# Patient Record
Sex: Female | Born: 1980 | Race: White | Hispanic: No | Marital: Married | Smoking: Former smoker
Health system: Southern US, Community
[De-identification: ages and names within clinical notes are randomized; demographics above are authoritative.]

## PROBLEM LIST (undated history)

## (undated) ENCOUNTER — Inpatient Hospital Stay (HOSPITAL_COMMUNITY): Payer: Self-pay

## (undated) DIAGNOSIS — N3281 Overactive bladder: Secondary | ICD-10-CM

## (undated) DIAGNOSIS — M1990 Unspecified osteoarthritis, unspecified site: Secondary | ICD-10-CM

## (undated) DIAGNOSIS — F32A Depression, unspecified: Secondary | ICD-10-CM

## (undated) DIAGNOSIS — R87619 Unspecified abnormal cytological findings in specimens from cervix uteri: Secondary | ICD-10-CM

## (undated) DIAGNOSIS — IMO0002 Reserved for concepts with insufficient information to code with codable children: Secondary | ICD-10-CM

## (undated) DIAGNOSIS — R87629 Unspecified abnormal cytological findings in specimens from vagina: Secondary | ICD-10-CM

## (undated) DIAGNOSIS — D649 Anemia, unspecified: Secondary | ICD-10-CM

## (undated) HISTORY — PX: WISDOM TOOTH EXTRACTION: SHX21

## (undated) HISTORY — PX: URETHRAL SLING: SHX2621

## (undated) HISTORY — DX: Reserved for concepts with insufficient information to code with codable children: IMO0002

## (undated) HISTORY — DX: Anemia, unspecified: D64.9

## (undated) HISTORY — DX: Depression, unspecified: F32.A

## (undated) HISTORY — PX: INCONTINENCE SURGERY: SHX676

## (undated) HISTORY — DX: Unspecified osteoarthritis, unspecified site: M19.90

## (undated) HISTORY — DX: Unspecified abnormal cytological findings in specimens from cervix uteri: R87.619

## (undated) HISTORY — PX: LEEP: SHX91

---

## 2008-02-07 ENCOUNTER — Encounter: Payer: Self-pay | Admitting: Obstetrics and Gynecology

## 2008-02-07 ENCOUNTER — Ambulatory Visit: Payer: Self-pay | Admitting: Obstetrics & Gynecology

## 2008-02-14 ENCOUNTER — Ambulatory Visit: Payer: Self-pay | Admitting: Obstetrics & Gynecology

## 2008-02-18 ENCOUNTER — Inpatient Hospital Stay (HOSPITAL_COMMUNITY): Admission: AD | Admit: 2008-02-18 | Discharge: 2008-02-18 | Payer: Self-pay | Admitting: Obstetrics & Gynecology

## 2008-02-18 ENCOUNTER — Ambulatory Visit: Payer: Self-pay | Admitting: Obstetrics and Gynecology

## 2008-02-21 ENCOUNTER — Ambulatory Visit: Payer: Self-pay | Admitting: Obstetrics & Gynecology

## 2008-02-25 ENCOUNTER — Inpatient Hospital Stay (HOSPITAL_COMMUNITY): Admission: AD | Admit: 2008-02-25 | Discharge: 2008-02-27 | Payer: Self-pay | Admitting: Obstetrics and Gynecology

## 2008-02-25 ENCOUNTER — Ambulatory Visit: Payer: Self-pay | Admitting: Obstetrics and Gynecology

## 2009-05-06 ENCOUNTER — Ambulatory Visit (HOSPITAL_BASED_OUTPATIENT_CLINIC_OR_DEPARTMENT_OTHER): Admission: RE | Admit: 2009-05-06 | Discharge: 2009-05-06 | Payer: Self-pay | Admitting: Urology

## 2009-11-06 ENCOUNTER — Encounter: Admission: RE | Admit: 2009-11-06 | Discharge: 2010-02-04 | Payer: Self-pay | Admitting: Urology

## 2011-02-07 LAB — PROTIME-INR: Prothrombin Time: 13.4 seconds (ref 11.6–15.2)

## 2011-02-07 LAB — DIFFERENTIAL
Basophils Absolute: 0 10*3/uL (ref 0.0–0.1)
Eosinophils Relative: 3 % (ref 0–5)
Lymphocytes Relative: 39 % (ref 12–46)

## 2011-02-07 LAB — TYPE AND SCREEN: Antibody Screen: NEGATIVE

## 2011-02-07 LAB — CBC
HCT: 39.3 % (ref 36.0–46.0)
Platelets: 285 10*3/uL (ref 150–400)
RDW: 12.3 % (ref 11.5–15.5)

## 2011-02-07 LAB — ABO/RH: ABO/RH(D): O POS

## 2011-03-16 NOTE — Op Note (Signed)
Heather Christian, Heather Christian            ACCOUNT NO.:  [id number]   MEDICAL RECORD NO.:  [id number]          PATIENT TYPE:  AMB   LOCATION:  [facility]                         FACILITY:  [facility]   PHYSICIAN:  Martina Sinner, MD DATE OF BIRTH:  12-17-1980   DATE OF PROCEDURE:  DATE OF DISCHARGE:                               OPERATIVE REPORT   PREOPERATIVE DIAGNOSIS:  Stress urinary incontinence.   POSTOPERATIVE DIAGNOSIS:  Stress urinary incontinence.   SURGERY:  Sling cystourethropexy [facility]) plus cystoscopy.   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  She consented to a sling.   The patient was prepped and draped in the usual fashion.  Extra care was  taken in leg positioning to minimize the risk of compartment syndrome,  neuropathy and DVT.  Preoperative antibiotics were given.   Two 1 cm incisions were made one fingerbreadth above the symphysis pubis  and 1.5 cm lateral to the midline.  A 2 cm incision was made underlying  the mid urethra, using a marking pen and lidocaine-epinephrine mixture  to develop a nice plane.  I sharply dissected to the urethrovesical  angle bilaterally.   When the bladder emptied, I passed a SPARC needle on top of on the back  of the symphysis pubic on the pulp of my index finger bilaterally.  I  cystoscoped the patient, and there was no injury to bladder or urethra,  and there was excellent efflux of blue dye bilaterally.  There was no  indentation __________ of the trocar.   With the bladder emptied, I attached a sling and brought it up through  the retropubic space.  I tensioned over the fat part of a moderate-sized  Kelly clamp.  I was very happy with the position and tension of the  sling and its lack of spring back.   Irrigation was utilized.  Sling was cut below the skin level.  4-0  Vicryl and Dermabond was used for abdominal incisions.  2-0 running TTY  needle was used for the vaginal incision followed by 2  interrupted  sutures.  Vaginal pack was inserted.   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  I did keep the sling in the  midurethra, and I do not expect any hinge effect.   Heather Christian will be followed up per protocol.           ______________________________  Martina Sinner, MD  Electronically Signed    SAM/MEDQ  D:  05/06/2009  T:  05/06/2009  Job:  [id number]

## 2011-05-17 ENCOUNTER — Ambulatory Visit (INDEPENDENT_AMBULATORY_CARE_PROVIDER_SITE_OTHER): Payer: Medicaid Other

## 2011-05-17 ENCOUNTER — Inpatient Hospital Stay (INDEPENDENT_AMBULATORY_CARE_PROVIDER_SITE_OTHER)
Admission: RE | Admit: 2011-05-17 | Discharge: 2011-05-17 | Disposition: A | Discharge status: Home / Self Care | Payer: Medicaid Other | Source: Ambulatory Visit | Attending: Family Medicine | Admitting: Family Medicine

## 2011-05-17 DIAGNOSIS — J189 Pneumonia, unspecified organism: Secondary | ICD-10-CM

## 2011-05-28 ENCOUNTER — Ambulatory Visit (INDEPENDENT_AMBULATORY_CARE_PROVIDER_SITE_OTHER): Payer: Medicaid Other

## 2011-05-28 ENCOUNTER — Inpatient Hospital Stay (INDEPENDENT_AMBULATORY_CARE_PROVIDER_SITE_OTHER)
Admission: RE | Admit: 2011-05-28 | Discharge: 2011-05-28 | Disposition: A | Discharge status: Home / Self Care | Payer: Medicaid Other | Source: Ambulatory Visit | Attending: Emergency Medicine | Admitting: Emergency Medicine

## 2011-05-28 DIAGNOSIS — M722 Plantar fascial fibromatosis: Secondary | ICD-10-CM

## 2011-05-28 DIAGNOSIS — J189 Pneumonia, unspecified organism: Secondary | ICD-10-CM

## 2011-05-31 ENCOUNTER — Inpatient Hospital Stay (INDEPENDENT_AMBULATORY_CARE_PROVIDER_SITE_OTHER)
Admission: RE | Admit: 2011-05-31 | Discharge: 2011-05-31 | Disposition: A | Discharge status: Home / Self Care | Payer: Medicaid Other | Source: Ambulatory Visit | Attending: Emergency Medicine | Admitting: Emergency Medicine

## 2011-05-31 DIAGNOSIS — R05 Cough: Secondary | ICD-10-CM

## 2011-05-31 DIAGNOSIS — J189 Pneumonia, unspecified organism: Secondary | ICD-10-CM

## 2011-07-19 ENCOUNTER — Inpatient Hospital Stay (HOSPITAL_COMMUNITY)
Admission: AD | Admit: 2011-07-19 | Discharge: 2011-07-20 | Disposition: A | Discharge status: Home / Self Care | Payer: Medicaid Other | Source: Ambulatory Visit | Attending: Obstetrics & Gynecology | Admitting: Obstetrics & Gynecology

## 2011-07-19 ENCOUNTER — Inpatient Hospital Stay (INDEPENDENT_AMBULATORY_CARE_PROVIDER_SITE_OTHER)
Admission: RE | Admit: 2011-07-19 | Discharge: 2011-07-19 | Disposition: A | Discharge status: Home / Self Care | Payer: Medicaid Other | Source: Ambulatory Visit | Attending: Family Medicine | Admitting: Family Medicine

## 2011-07-19 ENCOUNTER — Encounter (HOSPITAL_COMMUNITY): Payer: Self-pay | Admitting: *Deleted

## 2011-07-19 DIAGNOSIS — N390 Urinary tract infection, site not specified: Secondary | ICD-10-CM

## 2011-07-19 DIAGNOSIS — R109 Unspecified abdominal pain: Secondary | ICD-10-CM | POA: Insufficient documentation

## 2011-07-19 DIAGNOSIS — D72829 Elevated white blood cell count, unspecified: Secondary | ICD-10-CM | POA: Insufficient documentation

## 2011-07-19 DIAGNOSIS — N949 Unspecified condition associated with female genital organs and menstrual cycle: Secondary | ICD-10-CM

## 2011-07-19 DIAGNOSIS — Z975 Presence of (intrauterine) contraceptive device: Secondary | ICD-10-CM

## 2011-07-19 DIAGNOSIS — N83209 Unspecified ovarian cyst, unspecified side: Secondary | ICD-10-CM | POA: Insufficient documentation

## 2011-07-19 HISTORY — DX: Overactive bladder: N32.81

## 2011-07-19 LAB — POCT URINALYSIS DIP (DEVICE)
Hgb urine dipstick: NEGATIVE
Protein, ur: NEGATIVE mg/dL
Specific Gravity, Urine: 1.015 (ref 1.005–1.030)
Urobilinogen, UA: 0.2 mg/dL (ref 0.0–1.0)
pH: 7.5 (ref 5.0–8.0)

## 2011-07-19 LAB — WET PREP, GENITAL

## 2011-07-19 NOTE — Progress Notes (Signed)
Pt reports pain in lower abd mostly on rt side, hurts to sneeze, hurts to have intercourse, hurts to turn over in bed x 4 days. Denies fever. Dysuria. Has had IUD x 3 yrs.

## 2011-07-20 ENCOUNTER — Inpatient Hospital Stay (HOSPITAL_COMMUNITY): Payer: Medicaid Other

## 2011-07-20 DIAGNOSIS — R109 Unspecified abdominal pain: Secondary | ICD-10-CM | POA: Diagnosis present

## 2011-07-20 DIAGNOSIS — Z975 Presence of (intrauterine) contraceptive device: Secondary | ICD-10-CM

## 2011-07-20 LAB — DIFFERENTIAL
Basophils Absolute: 0 10*3/uL (ref 0.0–0.1)
Eosinophils Absolute: 0.1 10*3/uL (ref 0.0–0.7)
Eosinophils Relative: 1 % (ref 0–5)
Monocytes Absolute: 1 10*3/uL (ref 0.1–1.0)

## 2011-07-20 LAB — CBC
MCH: 30.1 pg (ref 26.0–34.0)
MCV: 90.8 fL (ref 78.0–100.0)
Platelets: 289 10*3/uL (ref 150–400)
RDW: 12.9 % (ref 11.5–15.5)

## 2011-07-20 LAB — GC/CHLAMYDIA PROBE AMP, GENITAL: GC Probe Amp, Genital: NEGATIVE

## 2011-07-20 MED ORDER — CIPROFLOXACIN HCL 500 MG PO TABS
500.0000 mg | ORAL_TABLET | Freq: Once | ORAL | Status: AC
  Administered 2011-07-20: 500 mg via ORAL
  Filled 2011-07-20: qty 1

## 2011-07-20 MED ORDER — PHENAZOPYRIDINE HCL 200 MG PO TABS: 200.0000 mg | ORAL_TABLET | Freq: Three times a day (TID) | ORAL | Status: AC

## 2011-07-20 MED ORDER — IBUPROFEN 600 MG PO TABS: 600.0000 mg | ORAL_TABLET | Freq: Four times a day (QID) | ORAL | Status: AC | PRN

## 2011-07-20 MED ORDER — OXYCODONE-ACETAMINOPHEN 5-325 MG PO TABS: 1.0000 | ORAL_TABLET | ORAL | Status: AC | PRN

## 2011-07-20 MED ORDER — PHENAZOPYRIDINE HCL 100 MG PO TABS
200.0000 mg | ORAL_TABLET | Freq: Once | ORAL | Status: AC
  Administered 2011-07-20: 200 mg via ORAL
  Filled 2011-07-20: qty 2

## 2011-07-20 MED ORDER — OXYCODONE-ACETAMINOPHEN 5-325 MG PO TABS
2.0000 | ORAL_TABLET | Freq: Once | ORAL | Status: AC
  Administered 2011-07-20: 2 via ORAL
  Filled 2011-07-20: qty 2

## 2011-07-20 MED ORDER — CIPROFLOXACIN HCL 500 MG PO TABS: 500.0000 mg | ORAL_TABLET | Freq: Two times a day (BID) | ORAL | Status: AC

## 2011-07-20 NOTE — ED Provider Notes (Signed)
History   Low abd pain, LBP and dysuria x 4 days. Seen at [facility]. Wet prep, GC/CT, UA collected. IUD in place x 3 years. Strings feel shorter.   CSN: [id number] Arrival date & time: 07/19/2011 10:45 PM   Chief Complaint  Patient presents with  . Abdominal Pain     (Include location/radiation/quality/duration/timing/severity/associated sxs/prior treatment) HPI Low abd pain, LBP and dysuria x 4 days. Seen at [facility]. Wet prep, GC/CT, UA collected. IUD in place x 3 years. Strings feel shorter. Pain sharp, worse w/ mvmt, no change since onset.    Past Medical History  Diagnosis Date  . Overactive bladder      No past surgical history on file. Urethral sling  No family history on file.  History  Substance Use Topics  . Smoking status: Former Games developer  . Smokeless tobacco: Not on file  . Alcohol Use: No    OB History    Grav Para Term Preterm Abortions TAB SAB Ect Mult Living                  Review of Systems  Constitutional: Negative for fever, chills and appetite change.  Gastrointestinal: Positive for abdominal pain (Bilat low adb, greater w/ mvmt). Negative for nausea, vomiting, diarrhea, constipation, blood in stool and abdominal distention.  Genitourinary: Positive for dysuria (and difficulty emptying). Negative for urgency, frequency, hematuria, flank pain, vaginal bleeding and pelvic pain.  Musculoskeletal: Positive for back pain (bilat LBP (chronic, but worse over past 4 days)).    Allergies  Review of patient's allergies indicates no known allergies.  Home Medications  No current outpatient prescriptions on file.  Physical Exam    BP 108/65  Pulse 88  Temp(Src) 99.6 F (37.6 C) (Oral)  Resp 18  LMP 07/06/2011  Physical Exam  Constitutional: She is oriented to person, place, and time. She appears well-developed and well-nourished. She appears distressed (mild).  Cardiovascular: Normal rate.   Abdominal: Soft. Bowel sounds are normal. She exhibits no  distension and no mass. There is tenderness (LLQ, RLQ and inferior to umbilicis). There is no rebound, no guarding, no CVA tenderness and no tenderness at McBurney's point. Hernia confirmed negative in the ventral area.    Genitourinary:       Deferred due to recent exam  Musculoskeletal: She exhibits tenderness (bilat low back).  Neurological: She is alert and oriented to person, place, and time.  Skin: Skin is warm and dry. She is not diaphoretic.  Psychiatric: She has a normal mood and affect.    ED Course  Procedures  Results for orders placed during the hospital encounter of 07/19/11  POCT URINALYSIS DIP (DEVICE)      Component Value Range   Glucose, UA NEGATIVE  NEGATIVE (mg/dL)   Bilirubin Urine NEGATIVE  NEGATIVE    Ketones, ur NEGATIVE  NEGATIVE (mg/dL)   Specific Gravity, Urine 1.015  1.005 - 1.030    Hgb urine dipstick NEGATIVE  NEGATIVE    pH 7.5  5.0 - 8.0    Protein, ur NEGATIVE  NEGATIVE (mg/dL)   Urobilinogen, UA 0.2  0.0 - 1.0 (mg/dL)   Nitrite NEGATIVE  NEGATIVE    Leukocytes, UA TRACE (*) NEGATIVE   POCT PREGNANCY, URINE      Component Value Range   Preg Test, Ur NEGATIVE    WET PREP, GENITAL      Component Value Range   Yeast, Wet Prep NONE SEEN  NONE SEEN    Trich, Wet Prep NONE SEEN  NONE SEEN    Clue Cells, Wet Prep NONE SEEN  NONE SEEN    WBC, Wet Prep HPF POC MANY (*) NONE SEEN    Results for orders placed during the hospital encounter of 07/19/11 (from the past 24 hour(s))  CBC     Status: Abnormal   Collection Time   07/20/11 12:50 AM      Component Value Range   WBC 13.7 (*) 4.0 - 10.5 (K/uL)   RBC 4.12  3.87 - 5.11 (MIL/uL)   Hemoglobin 12.4  12.0 - 15.0 (g/dL)   HCT 16.1  09.6 - 04.5 (%)   MCV 90.8  78.0 - 100.0 (fL)   MCH 30.1  26.0 - 34.0 (pg)   MCHC 33.2  30.0 - 36.0 (g/dL)   RDW 40.9  81.1 - 91.4 (%)   Platelets 289  150 - 400 (K/uL)  DIFFERENTIAL     Status: Abnormal   Collection Time   07/20/11 12:50 AM      Component Value Range    Neutrophils Relative 73  43 - 77 (%)   Neutro Abs 10.0 (*) 1.7 - 7.7 (K/uL)   Lymphocytes Relative 19  12 - 46 (%)   Lymphs Abs 2.6  0.7 - 4.0 (K/uL)   Monocytes Relative 7  3 - 12 (%)   Monocytes Absolute 1.0  0.1 - 1.0 (K/uL)   Eosinophils Relative 1  0 - 5 (%)   Eosinophils Absolute 0.1  0.0 - 0.7 (K/uL)   Basophils Relative 0  0 - 1 (%)   Basophils Absolute 0.0  0.0 - 0.1 (K/uL)   US Transvaginal Non-ob  07/20/2011  *RADIOLOGY REPORT*  Clinical Data: 30 year old female with severe pelvic pain.  Patient with IUD.  Negative pregnancy test.  TRANSABDOMINAL AND TRANSVAGINAL ULTRASOUND OF PELVIS Technique:  Both transabdominal and transvaginal ultrasound examinations of the pelvis were performed. Transabdominal technique was performed for global imaging of the pelvis including uterus, ovaries, adnexal regions, and pelvic cul-de-sac.  Comparison: None   It was necessary to proceed with endovaginal exam following the transabdominal exam to visualize the ovaries and endometrium.  Findings:  Uterus: The uterus is retroverted and measures 9.8 x 5 x 5.5 cm. No focal uterine masses are identified.  Endometrium: An IUD within the uterine body obscures measurement of the endometrium.  Right ovary:  A 4.5 x 2.9 x 2.3 cm minimally complex cyst is identified without internal vascular flow.  The right ovary is otherwise unremarkable measuring 6 x 3.8 x 3 cm.  Left ovary: Left ovary is unremarkable measuring 5.2 x 2.6 x 1.8 cm.  Other findings: There is no evidence of free fluid or adnexal mass.  IMPRESSION: 4.5 x 2.9 x 2.3 cm minimally complex right ovarian cyst.  Although this cyst is almost certainly benign, follow-up in 6-12 weeks is recommended  IUD within the uterus.  No other significant abnormalities identified.  Original Report Authenticated By: Rosendo Gros, M.D.    Assessment: 1. Right minimally complex ovarian cyst 2. Possible UTI 3. Mild leukocytosis and low-grade fever possible due to UTI.    Plan: 1. F/U in Gyn clinic in 8 weeks for [place] to re-eval cyst or MAU PRN for worsening of Sx 2. Urine culture 3. Rx Cipro 500 mg PO BID a 3 days. First dose given 4. Rx Pyridium, Percocet 5/325 #20 and Ibuprofen  Kyannah Climer 07/20/2011 1:51 AM

## 2011-07-21 LAB — URINE CULTURE
Colony Count: NO GROWTH
Culture  Setup Time: 201209180814
Special Requests: NORMAL

## 2011-07-23 NOTE — ED Provider Notes (Signed)
Agree with above note.  Heather Christian H. 07/23/2011 8:27 AM

## 2011-07-27 LAB — POCT URINALYSIS DIP (DEVICE)
Ketones, ur: NEGATIVE
Operator id: 120861
Operator id: 159681
Operator id: 297281
Protein, ur: 30 — AB
Protein, ur: 100 — AB
Protein, ur: NEGATIVE
Specific Gravity, Urine: 1.015
Specific Gravity, Urine: 1.02
Urobilinogen, UA: 0.2
Urobilinogen, UA: 0.2
Urobilinogen, UA: 0.2
pH: 6
pH: 6
pH: 7

## 2011-07-27 LAB — CBC
HCT: 33.4 — ABNORMAL LOW
MCV: 87.4
Platelets: 243
RDW: 14.6
WBC: 9.3

## 2011-07-27 LAB — RPR: RPR Ser Ql: NONREACTIVE

## 2011-08-03 ENCOUNTER — Telehealth: Payer: Self-pay | Admitting: *Deleted

## 2011-08-03 ENCOUNTER — Other Ambulatory Visit: Payer: Self-pay | Admitting: *Deleted

## 2011-08-03 DIAGNOSIS — N83299 Other ovarian cyst, unspecified side: Secondary | ICD-10-CM

## 2011-08-03 NOTE — Telephone Encounter (Signed)
Pt notified of appt for follow up US on 08/31/11 @ 1000. The doctor will discuss the results with her @ clinic appt on 09/01/11. Pt voiced understanding.

## 2011-08-31 ENCOUNTER — Ambulatory Visit (HOSPITAL_COMMUNITY)
Admission: RE | Admit: 2011-08-31 | Discharge: 2011-08-31 | Disposition: A | Discharge status: Home / Self Care | Payer: Medicaid Other | Source: Ambulatory Visit | Attending: Obstetrics & Gynecology | Admitting: Obstetrics & Gynecology

## 2011-08-31 DIAGNOSIS — N83299 Other ovarian cyst, unspecified side: Secondary | ICD-10-CM

## 2011-08-31 DIAGNOSIS — N83209 Unspecified ovarian cyst, unspecified side: Secondary | ICD-10-CM | POA: Insufficient documentation

## 2011-09-01 ENCOUNTER — Encounter: Payer: Self-pay | Admitting: Obstetrics & Gynecology

## 2011-09-01 ENCOUNTER — Ambulatory Visit (INDEPENDENT_AMBULATORY_CARE_PROVIDER_SITE_OTHER): Payer: Medicaid Other | Admitting: Obstetrics & Gynecology

## 2011-09-01 VITALS — BP 105/67 | HR 73 | Temp 97.3°F | Ht 62.0 in | Wt 172.1 lb

## 2011-09-01 DIAGNOSIS — N83209 Unspecified ovarian cyst, unspecified side: Secondary | ICD-10-CM

## 2011-09-01 DIAGNOSIS — N879 Dysplasia of cervix uteri, unspecified: Secondary | ICD-10-CM | POA: Insufficient documentation

## 2011-09-01 DIAGNOSIS — Z23 Encounter for immunization: Secondary | ICD-10-CM

## 2011-09-01 DIAGNOSIS — Z0000 Encounter for general adult medical examination without abnormal findings: Secondary | ICD-10-CM

## 2011-09-01 MED ORDER — INFLUENZA VIRUS VACC SPLIT PF IM SUSP
0.5000 mL | Freq: Once | INTRAMUSCULAR | Status: AC
  Administered 2011-09-01: 0.5 mL via INTRAMUSCULAR

## 2011-09-01 NOTE — Patient Instructions (Signed)

## 2011-09-01 NOTE — Progress Notes (Signed)
  Subjective:    Patient ID: Heather Christian, female    DOB: 1980-12-20, 30 y.o.   MRN: [id number]  HPI Patient was seen in 07/20/2011 for lower abdominal pain and found to have a 4 cm simple ovarian cyst.  Followup ultrasound on 10./30/12 showed resolution of cyst, no other anomalies. Desires flu shot.  08/31/2011  TRANSVAGINAL ULTRASOUND OF PELVIS  Comparison:  July 20, 2011  Findings: The uterus is normal in size and echotexture, measuring 7.2 x 4.6 x 5.0 cm.  The endometrial stripe is thin and homogeneous, measuring 8.7 mm in width.  The IUD is located within the endometrial cavity at the fundus.  Both ovaries have a normal size and appearance.  The right ovary measures 4.6 x 2.8 x 3.5 cm, and the left ovary measures 2.5 x 2.0 x 1.7 cm.  The previously seen complex  right ovarian cyst has resolved.  There are no adnexal masses.  Trace free pelvic fluid is noted.  IMPRESSION: Normal pelvic ultrasound.  The previously seen complex right ovarian cyst has resolved.  Last pap smear was last year and she reports history of abnormal pap smears and ?LEEP. The following portions of the patient's history were reviewed and updated as appropriate: allergies, current medications, past family history, past medical history, past social history, past surgical history and problem list.  Review of Systems  Negative    Objective:   Physical Exam Blood pressure 105/67, pulse 73, temperature 97.3 F (36.3 C), temperature source Oral, height 5\' 2"  (1.575 m), weight 172 lb 1.6 oz (78.064 kg), last menstrual period 08/31/2011. Exam deferred    Assessment & Plan:  Flu vaccine administered today. Patient was reassured. She will be scheduled for annual exam soon in this clinic. Return to clinic for any gynecologic concerns or her annual exam.

## 2011-09-14 ENCOUNTER — Telehealth: Payer: Self-pay | Admitting: *Deleted

## 2011-09-14 NOTE — Telephone Encounter (Signed)
Pt left message stating that she needs to know her appt date and time. I returned her call and was not able to leave a message- no answer. Pt needs to be informed that appt is 09/30/11 @ 1330

## 2011-09-15 NOTE — Telephone Encounter (Signed)
Called pt- spoke with her mother-in-law. She stated that Seferina is @ work and will be home today @ 1630-1700. She will also be available tomorrow before 0900. I stated that I will call back.

## 2011-09-17 NOTE — Telephone Encounter (Signed)
Called pt and left message of appt time and date.

## 2011-09-30 ENCOUNTER — Ambulatory Visit: Payer: Medicaid Other | Admitting: Advanced Practice Midwife

## 2011-10-07 ENCOUNTER — Ambulatory Visit (INDEPENDENT_AMBULATORY_CARE_PROVIDER_SITE_OTHER): Payer: Medicaid Other | Admitting: Family

## 2011-10-07 ENCOUNTER — Encounter: Payer: Self-pay | Admitting: Family

## 2011-10-07 ENCOUNTER — Other Ambulatory Visit (HOSPITAL_COMMUNITY)
Admission: RE | Admit: 2011-10-07 | Discharge: 2011-10-07 | Disposition: A | Discharge status: Home / Self Care | Payer: Medicaid Other | Source: Ambulatory Visit | Attending: Family | Admitting: Family

## 2011-10-07 VITALS — BP 100/62 | HR 64 | Temp 97.1°F | Ht 64.0 in | Wt 174.1 lb

## 2011-10-07 DIAGNOSIS — Z01419 Encounter for gynecological examination (general) (routine) without abnormal findings: Secondary | ICD-10-CM

## 2011-10-07 DIAGNOSIS — Z1159 Encounter for screening for other viral diseases: Secondary | ICD-10-CM | POA: Insufficient documentation

## 2011-10-07 NOTE — Patient Instructions (Addendum)

## 2011-10-07 NOTE — Progress Notes (Signed)
  Subjective:    Patient ID: Heather Christian, female    DOB: 12-23-1980, 30 y.o.   MRN: [id number]  Gynecologic Exam  Pt is here for Well Woman Exam.  Denies any problems or concerns at this visit.      Review of Systems  Constitutional: Negative.   HENT: Negative.   Respiratory: Negative.   Cardiovascular: Negative.   Gastrointestinal: Negative.   Genitourinary: Negative.   Musculoskeletal: Negative.   Neurological: Negative.        Objective:   Physical Exam  Constitutional: She is oriented to person, place, and time. She appears well-developed and well-nourished. No distress.  HENT:  Head: Normocephalic.  Neck: Normal range of motion. Neck supple. No tracheal deviation present. No thyromegaly present.  Cardiovascular: Normal rate, regular rhythm and normal heart sounds.   Pulmonary/Chest: Effort normal and breath sounds normal. No respiratory distress.  Abdominal: Soft. There is no tenderness.  Genitourinary: Cervix exhibits no motion tenderness, no discharge and no friability.       IUD strings visible  Neurological: She is alert and oriented to person, place, and time.  Skin: Skin is warm and dry.          Assessment & Plan:  Well Woman Exam  Plan: Pap smear to lab Reviewed breast self exam Return in one year or sooner if needed.  [facility]

## 2013-07-13 ENCOUNTER — Ambulatory Visit (INDEPENDENT_AMBULATORY_CARE_PROVIDER_SITE_OTHER): Payer: Medicaid Other | Admitting: Advanced Practice Midwife

## 2013-07-13 ENCOUNTER — Encounter: Payer: Self-pay | Admitting: Advanced Practice Midwife

## 2013-07-13 VITALS — BP 122/79 | HR 65 | Temp 96.8°F | Ht 62.25 in | Wt 181.2 lb

## 2013-07-13 DIAGNOSIS — Z3009 Encounter for other general counseling and advice on contraception: Secondary | ICD-10-CM

## 2013-07-13 DIAGNOSIS — Z30432 Encounter for removal of intrauterine contraceptive device: Secondary | ICD-10-CM

## 2013-07-13 DIAGNOSIS — Z30011 Encounter for initial prescription of contraceptive pills: Secondary | ICD-10-CM

## 2013-07-13 MED ORDER — NORGESTIMATE-ETH ESTRADIOL 0.25-35 MG-MCG PO TABS: 1.0000 | ORAL_TABLET | Freq: Every day | ORAL | Status: DC

## 2013-07-13 NOTE — Patient Instructions (Signed)

## 2013-07-13 NOTE — Progress Notes (Signed)
  Subjective:    Patient ID: Heather Christian, female    DOB: 06-28-1981, 31 y.o.   MRN: [id number]  HPI: Here for IUD removal due to expiration. Plans to start OCPs. Last Pap in [place] normal ,neg NPV 04/2013. Hx HSIL. Plans yearly Paps due to Hx abnormal.   Review of Systems: Neg     Objective:   Physical Exam BP 122/79  Pulse 65  Temp(Src) 96.8 F (36 C) (Oral)  Ht 5' 2.25" (1.581 m)  Wt 181 lb 3.2 oz (82.192 kg)  BMI 32.88 kg/m2  LMP 07/05/2013  Procedure: Time-out performed.  Speculum placed. String visualized and grasped w/ ring forceps. IUD removed w/out difficulty. Pt tolerated well. Nabothian cysts seen.     Assessment & Plan:  Encounter for IUD removal  Plan: Rx Sprintec. F/U in 3 months or PRN.  Myra, CNM 07/13/2013 10:07 AM

## 2013-07-16 ENCOUNTER — Encounter (HOSPITAL_COMMUNITY): Payer: Self-pay | Admitting: Emergency Medicine

## 2013-07-16 ENCOUNTER — Encounter: Payer: Self-pay | Admitting: *Deleted

## 2013-07-16 ENCOUNTER — Emergency Department (INDEPENDENT_AMBULATORY_CARE_PROVIDER_SITE_OTHER)
Admission: EM | Admit: 2013-07-16 | Discharge: 2013-07-16 | Disposition: A | Discharge status: Home / Self Care | Payer: Self-pay | Source: Home / Self Care | Attending: Family Medicine | Admitting: Family Medicine

## 2013-07-16 ENCOUNTER — Emergency Department (INDEPENDENT_AMBULATORY_CARE_PROVIDER_SITE_OTHER): Payer: Medicaid Other

## 2013-07-16 DIAGNOSIS — S93402A Sprain of unspecified ligament of left ankle, initial encounter: Secondary | ICD-10-CM

## 2013-07-16 DIAGNOSIS — S93409A Sprain of unspecified ligament of unspecified ankle, initial encounter: Secondary | ICD-10-CM

## 2013-07-16 MED ORDER — MELOXICAM 15 MG PO TABS: 15.0000 mg | ORAL_TABLET | Freq: Every day | ORAL | Status: DC | PRN

## 2013-07-16 NOTE — ED Notes (Addendum)
C/o twisting left ankle last night while trying to mow the lawn around 7 pm.   Patient states she did ice foot.

## 2013-07-16 NOTE — ED Provider Notes (Signed)
Heather Christian is a 32 y.o. female who presents to Urgent Care today for ankle injury occurring last night. Patient referred her left ankle yesterday evening. She notes pain and swelling laterally. The pain is worst activity better with rest. She denies any radiating pain weakness numbness fevers or chills. She has not tried any medications yet. She feels well otherwise.   Past Medical History  Diagnosis Date  . Overactive bladder   . Abnormal Pap smear     LEEP 2008   History  Substance Use Topics  . Smoking status: Former Games developer  . Smokeless tobacco: Not on file  . Alcohol Use: No   ROS as above Medications reviewed. No current facility-administered medications for this encounter.   Current Outpatient Prescriptions  Medication Sig Dispense Refill  . B Complex Vitamins (VITAMIN B-COMPLEX PO) Take 1 tablet by mouth daily.      . cholecalciferol (VITAMIN D) 1000 UNITS tablet Take 1,000 Units by mouth daily.      . fesoterodine (TOVIAZ) 4 MG TB24 Take 4 mg by mouth daily.        Marland Kitchen levonorgestrel (MIRENA) 20 MCG/24HR IUD 1 each by Intrauterine route once.        . meloxicam (MOBIC) 15 MG tablet Take 1 tablet (15 mg total) by mouth daily as needed for pain.  14 tablet  0  . Multiple Vitamins-Minerals (DAILY MULTI VITAMIN/MINERALS PO) Take 1 tablet by mouth daily.        . norgestimate-ethinyl estradiol (ORTHO-CYCLEN,SPRINTEC,PREVIFEM) 0.25-35 MG-MCG tablet Take 1 tablet by mouth daily.  1 Package  13  . OVER THE COUNTER MEDICATION Take 1 tablet by mouth daily. Reflux medication      . ranitidine (ZANTAC) 75 MG tablet Take 75 mg by mouth 2 (two) times daily.          Exam:  BP 108/71  Pulse 62  Temp(Src) 97.6 F (36.4 C) (Oral)  Resp 18  SpO2 98%  LMP 07/05/2013 Gen: Well NAD MSK:  Left ankle swollen laterally. Tender palpation lateral malleolus. Capillary refill and sensation are intact distally Motion is intact Contralateral right ankle is normal-appearing no swelling  normal motion  No results found for this or any previous visit (from the past 24 hour(s)). Dg Ankle Complete Left  07/16/2013   CLINICAL DATA:  Pain post trauma  EXAM: LEFT ANKLE COMPLETE - 3+ VIEW  COMPARISON:  None.  FINDINGS: Frontal, oblique, and lateral views were obtained. There is no fracture or effusion. Ankle mortise appears intact. There is mild swelling laterally.  There is a small spur arising from the inferior calcaneus.  IMPRESSION: Swelling laterally. No fracture. Mortise intact.   Electronically Signed   By: Bretta Bang   On: 07/16/2013 11:13    Assessment and Plan: 32 y.o. female with left ankle sprain.  Plan for meloxicam rest ice home exercise program ASO brace.  Followup with sports medicine if not improved Discussed warning signs or symptoms. Please see discharge instructions. Patient expresses understanding.      Rodolph Bong, MD 07/16/13 1146

## 2014-10-09 ENCOUNTER — Other Ambulatory Visit: Payer: Self-pay | Admitting: Advanced Practice Midwife

## 2014-10-10 NOTE — Telephone Encounter (Signed)
No more refills after this. Needs Gyn appt.

## 2015-05-03 ENCOUNTER — Encounter (HOSPITAL_COMMUNITY): Payer: Self-pay | Admitting: Emergency Medicine

## 2015-05-03 ENCOUNTER — Emergency Department (HOSPITAL_COMMUNITY)
Admission: EM | Admit: 2015-05-03 | Discharge: 2015-05-04 | Disposition: A | Discharge status: Home / Self Care | Payer: Medicaid Other | Attending: Emergency Medicine | Admitting: Emergency Medicine

## 2015-05-03 DIAGNOSIS — Z23 Encounter for immunization: Secondary | ICD-10-CM | POA: Insufficient documentation

## 2015-05-03 DIAGNOSIS — Z79899 Other long term (current) drug therapy: Secondary | ICD-10-CM | POA: Insufficient documentation

## 2015-05-03 DIAGNOSIS — S81012A Laceration without foreign body, left knee, initial encounter: Secondary | ICD-10-CM | POA: Insufficient documentation

## 2015-05-03 DIAGNOSIS — Z87891 Personal history of nicotine dependence: Secondary | ICD-10-CM | POA: Insufficient documentation

## 2015-05-03 DIAGNOSIS — Z87448 Personal history of other diseases of urinary system: Secondary | ICD-10-CM | POA: Insufficient documentation

## 2015-05-03 DIAGNOSIS — Y9289 Other specified places as the place of occurrence of the external cause: Secondary | ICD-10-CM | POA: Insufficient documentation

## 2015-05-03 DIAGNOSIS — S8992XA Unspecified injury of left lower leg, initial encounter: Secondary | ICD-10-CM

## 2015-05-03 DIAGNOSIS — Y9389 Activity, other specified: Secondary | ICD-10-CM | POA: Insufficient documentation

## 2015-05-03 DIAGNOSIS — Y998 Other external cause status: Secondary | ICD-10-CM | POA: Insufficient documentation

## 2015-05-03 DIAGNOSIS — W01198A Fall on same level from slipping, tripping and stumbling with subsequent striking against other object, initial encounter: Secondary | ICD-10-CM | POA: Insufficient documentation

## 2015-05-03 MED ORDER — CEPHALEXIN 500 MG PO CAPS: 500.0000 mg | ORAL_CAPSULE | Freq: Two times a day (BID) | ORAL | Status: DC

## 2015-05-03 MED ORDER — LIDOCAINE HCL (PF) 2 % IJ SOLN
0.0000 mL | Freq: Once | INTRAMUSCULAR | Status: AC | PRN
Start: 2015-05-03 — End: 2015-05-03
  Administered 2015-05-03: 10 mL via INTRADERMAL
  Filled 2015-05-03: qty 10

## 2015-05-03 MED ORDER — TETANUS-DIPHTH-ACELL PERTUSSIS 5-2.5-18.5 LF-MCG/0.5 IM SUSP
0.5000 mL | Freq: Once | INTRAMUSCULAR | Status: AC
  Administered 2015-05-03: 0.5 mL via INTRAMUSCULAR
  Filled 2015-05-03: qty 0.5

## 2015-05-03 MED ORDER — HYDROCODONE-ACETAMINOPHEN 5-325 MG PO TABS
1.0000 | ORAL_TABLET | Freq: Once | ORAL | Status: AC
Start: 2015-05-04 — End: 2015-05-03
  Administered 2015-05-03: 1 via ORAL
  Filled 2015-05-03: qty 1
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
## 2015-05-03 NOTE — Discharge Instructions (Signed)
Return to the emergency room with worsening of symptoms, new symptoms or with symptoms that are concerning. Keep wound dry and do not remove dressing for 24 hours if possible. After that, wash gently morning and night (every 12 hours) with soap and water. Use a topical antibiotic ointment and cover with a bandaid or gauze.    Do NOT use rubbing alcohol or hydrogen peroxide, do not soak the area   Present to your primary care doctor or the urgent care of your choice, or the ED for suture removal in 7-10 days.   Every attempt was made to remove foreign body (contaminants) from the wound.  However, there is always a chance that some may remain in the wound. This can  increase your risk of infection.   If you see signs of infection (warmth, redness, tenderness, pus, sharp increase in pain, fever, red streaking in the skin) immediately return to the emergency department.   After the wound heals fully, apply sunscreen for 6-12 months to minimize scarring.  Read below information and follow recommendations. Please take all of your antibiotics until finished!   You may develop abdominal discomfort or diarrhea from the antibiotic.  You may help offset this with probiotics which you can buy or get in yogurt. Do not eat  or take the probiotics until 2 hours after your antibiotic.  Laceration Care, Adult A laceration is a cut or lesion that goes through all layers of the skin and into the tissue just beneath the skin. TREATMENT  Some lacerations may not require closure. Some lacerations may not be able to be closed due to an increased risk of infection. It is important to see your caregiver as soon as possible after an injury to minimize the risk of infection and maximize the opportunity for successful closure. If closure is appropriate, pain medicines may be given, if needed. The wound will be cleaned to help prevent infection. Your caregiver will use stitches (sutures), staples, wound glue (adhesive), or  skin adhesive strips to repair the laceration. These tools bring the skin edges together to allow for faster healing and a better cosmetic outcome. However, all wounds will heal with a scar. Once the wound has healed, scarring can be minimized by covering the wound with sunscreen during the day for 1 full year. HOME CARE INSTRUCTIONS  For sutures or staples:  Keep the wound clean and dry.  If you were given a bandage (dressing), you should change it at least once a day. Also, change the dressing if it becomes wet or dirty, or as directed by your caregiver.  Wash the wound with soap and water 2 times a day. Rinse the wound off with water to remove all soap. Pat the wound dry with a clean towel.  After cleaning, apply a thin layer of the antibiotic ointment as recommended by your caregiver. This will help prevent infection and keep the dressing from sticking.  You may shower as usual after the first 24 hours. Do not soak the wound in water until the sutures are removed.  Only take over-the-counter or prescription medicines for pain, discomfort, or fever as directed by your caregiver.  Get your sutures or staples removed as directed by your caregiver. For skin adhesive strips:  Keep the wound clean and dry.  Do not get the skin adhesive strips wet. You may bathe carefully, using caution to keep the wound dry.  If the wound gets wet, pat it dry with a clean towel.  Skin adhesive  strips will fall off on their own. You may trim the strips as the wound heals. Do not remove skin adhesive strips that are still stuck to the wound. They will fall off in time. For wound adhesive:  You may briefly wet your wound in the shower or bath. Do not soak or scrub the wound. Do not swim. Avoid periods of heavy perspiration until the skin adhesive has fallen off on its own. After showering or bathing, gently pat the wound dry with a clean towel.  Do not apply liquid medicine, cream medicine, or ointment  medicine to your wound while the skin adhesive is in place. This may loosen the film before your wound is healed.  If a dressing is placed over the wound, be careful not to apply tape directly over the skin adhesive. This may cause the adhesive to be pulled off before the wound is healed.  Avoid prolonged exposure to sunlight or tanning lamps while the skin adhesive is in place. Exposure to ultraviolet light in the first year will darken the scar.  The skin adhesive will usually remain in place for 5 to 10 days, then naturally fall off the skin. Do not pick at the adhesive film. You may need a tetanus shot if:  You cannot remember when you had your last tetanus shot.  You have never had a tetanus shot. If you get a tetanus shot, your arm may swell, get red, and feel warm to the touch. This is common and not a problem. If you need a tetanus shot and you choose not to have one, there is a rare chance of getting tetanus. Sickness from tetanus can be serious. SEEK MEDICAL CARE IF:   You have redness, swelling, or increasing pain in the wound.  You see a red line that goes away from the wound.  You have yellowish-white fluid (pus) coming from the wound.  You have a fever.  You notice a bad smell coming from the wound or dressing.  Your wound breaks open before or after sutures have been removed.  You notice something coming out of the wound such as wood or glass.  Your wound is on your hand or foot and you cannot move a finger or toe. SEEK IMMEDIATE MEDICAL CARE IF:   Your pain is not controlled with prescribed medicine.  You have severe swelling around the wound causing pain and numbness or a change in color in your arm, hand, leg, or foot.  Your wound splits open and starts bleeding.  You have worsening numbness, weakness, or loss of function of any joint around or beyond the wound.  You develop painful lumps near the wound or on the skin anywhere on your body. MAKE SURE YOU:     Understand these instructions.  Will watch your condition.  Will get help right away if you are not doing well or get worse. Document Released: 10/18/2005 Document Revised: 01/10/2012 Document Reviewed: 04/13/2011 [facility] Patient Information 2015 [employer], [place]. This information is not intended to replace advice given to you by your health care provider. Make sure you discuss any questions you have with your health care provider.

## 2015-05-03 NOTE — ED Notes (Signed)
Pt refused xray 

## 2015-05-03 NOTE — ED Notes (Signed)
Pt. fell from a raft and hit her left knee against a rock this evening , presents wit left knee skin tear with minimal bleeding .

## 2015-05-04 NOTE — ED Notes (Signed)
Pt verbalizes understanding of d/c instructions and denies any further needs at this time. 

## 2016-09-27 ENCOUNTER — Inpatient Hospital Stay (HOSPITAL_COMMUNITY)
Admission: AD | Admit: 2016-09-27 | Discharge: 2016-09-27 | Disposition: A | Discharge status: Home / Self Care | Payer: Medicaid Other | Source: Ambulatory Visit | Attending: Obstetrics and Gynecology | Admitting: Obstetrics and Gynecology

## 2016-09-27 ENCOUNTER — Inpatient Hospital Stay (HOSPITAL_COMMUNITY): Payer: Medicaid Other

## 2016-09-27 ENCOUNTER — Encounter (HOSPITAL_COMMUNITY): Payer: Self-pay | Admitting: *Deleted

## 2016-09-27 DIAGNOSIS — Z87891 Personal history of nicotine dependence: Secondary | ICD-10-CM | POA: Diagnosis not present

## 2016-09-27 DIAGNOSIS — Z3A01 Less than 8 weeks gestation of pregnancy: Secondary | ICD-10-CM | POA: Insufficient documentation

## 2016-09-27 DIAGNOSIS — Z79899 Other long term (current) drug therapy: Secondary | ICD-10-CM | POA: Diagnosis not present

## 2016-09-27 DIAGNOSIS — O209 Hemorrhage in early pregnancy, unspecified: Secondary | ICD-10-CM | POA: Diagnosis not present

## 2016-09-27 LAB — CBC WITH DIFFERENTIAL/PLATELET
BASOS ABS: 0 10*3/uL (ref 0.0–0.1)
BASOS PCT: 0 %
EOS PCT: 1 %
Eosinophils Absolute: 0.1 10*3/uL (ref 0.0–0.7)
HCT: 37.9 % (ref 36.0–46.0)
Hemoglobin: 13.1 g/dL (ref 12.0–15.0)
Lymphocytes Relative: 33 %
Lymphs Abs: 3.6 10*3/uL (ref 0.7–4.0)
MCH: 30.8 pg (ref 26.0–34.0)
MCHC: 34.6 g/dL (ref 30.0–36.0)
MCV: 89 fL (ref 78.0–100.0)
MONO ABS: 0.5 10*3/uL (ref 0.1–1.0)
MONOS PCT: 5 %
Neutro Abs: 6.7 10*3/uL (ref 1.7–7.7)
Neutrophils Relative %: 61 %
PLATELETS: 316 10*3/uL (ref 150–400)
RBC: 4.26 MIL/uL (ref 3.87–5.11)
RDW: 12.8 % (ref 11.5–15.5)
WBC: 11 10*3/uL — ABNORMAL HIGH (ref 4.0–10.5)

## 2016-09-27 LAB — URINALYSIS, ROUTINE W REFLEX MICROSCOPIC
Bilirubin Urine: NEGATIVE
Glucose, UA: NEGATIVE mg/dL
Ketones, ur: NEGATIVE mg/dL
NITRITE: NEGATIVE
PH: 6 (ref 5.0–8.0)
Protein, ur: NEGATIVE mg/dL
SPECIFIC GRAVITY, URINE: 1.025 (ref 1.005–1.030)

## 2016-09-27 LAB — URINE MICROSCOPIC-ADD ON: RBC / HPF: NONE SEEN RBC/hpf (ref 0–5)

## 2016-09-27 LAB — POCT PREGNANCY, URINE: PREG TEST UR: POSITIVE — AB

## 2016-09-27 NOTE — Discharge Instructions (Signed)
Vaginal Bleeding During Pregnancy, First Trimester °A small amount of bleeding (spotting) from the vagina is common in early pregnancy. Sometimes the bleeding is normal and is not a problem, and sometimes it is a sign of something serious. Be sure to tell your doctor about any bleeding from your vagina right away. °Follow these instructions at home: °· Watch your condition for any changes. °· Follow your doctor's instructions about how active you can be. °· If you are on bed rest: °¨ You may need to stay in bed and only get up to use the bathroom. °¨ You may be allowed to do some activities. °¨ If you need help, make plans for someone to help you. °· Write down: °¨ The number of pads you use each day. °¨ How often you change pads. °¨ How soaked (saturated) your pads are. °· Do not use tampons. °· Do not douche. °· Do not have sex or orgasms until your doctor says it is okay. °· If you pass any tissue from your vagina, save the tissue so you can show it to your doctor. °· Only take medicines as told by your doctor. °· Do not take aspirin because it can make you bleed. °· Keep all follow-up visits as told by your doctor. °Contact a doctor if: °· You bleed from your vagina. °· You have cramps. °· You have labor pains. °· You have a fever that does not go away after you take medicine. °Get help right away if: °· You have very bad cramps in your back or belly (abdomen). °· You pass large clots or tissue from your vagina. °· You bleed more. °· You feel light-headed or weak. °· You pass out (faint). °· You have chills. °· You are leaking fluid or have a gush of fluid from your vagina. °· You pass out while pooping (having a bowel movement). °This information is not intended to replace advice given to you by your health care provider. Make sure you discuss any questions you have with your health care provider. °Document Released: 03/04/2014 Document Revised: 03/25/2016 Document Reviewed: 06/25/2013 °Elsevier Interactive  Patient Education © 2017 [employer]. ° °

## 2016-09-27 NOTE — MAU Note (Signed)
PT  SAYS  SHE  TOOK HPT-  ON 10-21-  POSITIVE   X5.     SHE  [facility]   ON[facility]-   FOR  PREG  TEST.      HAS  AN APPOINTMENT   IN CLINIC- 12-4.       SHE STARTED  LIGHT  BLEEDING   ON 11-25   AFTER HAVING  SEX.        SAYS HAS DARK  BLOOD  IN UNDERWEAR- NO PAD.        CRAMPING STARTED     ALL PREG-   BUT  WORSE  AT 11 AM.  -  NO MEDS.

## 2016-09-27 NOTE — MAU Provider Note (Signed)
History     CSN: [id number]  Arrival date and time: 09/27/16 [id number]   First Provider Initiated Contact with Patient 09/27/16 2022      Chief Complaint  Patient presents with  . Vaginal Bleeding   HPI Ms. Johnna AcostaDeborah E Doscher is a 35 y.o. [phone] [id number] at [id number] who presents to MAU today with complaint of spotting x 2 days. The patient states spotting started after intercourse on Saturday. She states it has continued and noted more today than previously. She states mild associated lower abdominal cramping. She denies discharge, N/V/D or constipation or UTI symptoms. She states LMP 07/04/16.    OB History    Gravida Para Term Preterm AB Living   6 1     3 2    SAB TAB Ectopic Multiple Live Births   3       2      Past Medical History:  Diagnosis Date  . Abnormal Pap smear    LEEP 2008  . Overactive bladder     Past Surgical History:  Procedure Laterality Date  . INCONTINENCE SURGERY    . URETHRAL SLING    . WISDOM TOOTH EXTRACTION      Family History  Problem Relation Age of Onset  . Nephrolithiasis Mother   . Alcohol abuse Father     recovering  . Hypertension Father   . Heart disease Father     heart attack    Social History  Substance Use Topics  . Smoking status: Former Games developermoker  . Smokeless tobacco: Never Used  . Alcohol use No    Allergies:  Allergies  Allergen Reactions  . Azithromycin   . Erythromycin     Severe stomach pain    Prescriptions Prior to Admission  Medication Sig Dispense Refill Last Dose  . acetaminophen (TYLENOL) 500 MG tablet Take 500 mg by mouth every 6 (six) hours as needed for moderate pain.   09/26/2016 at Unknown time  . calcium carbonate (TUMS - DOSED IN MG ELEMENTAL CALCIUM) 500 MG chewable tablet Chew 2 tablets by mouth 4 (four) times daily as needed for indigestion or heartburn.   09/27/2016 at Unknown time  . fluticasone (FLONASE) 50 MCG/ACT nasal spray Place 1 spray into both nostrils daily as needed for allergies or rhinitis.    Past Week at Unknown time  . Prenatal Vit-Fe Fumarate-FA (PRENATAL MULTIVITAMIN) TABS tablet Take 1 tablet by mouth daily at 12 noon.   09/26/2016 at Unknown time  . ranitidine (ZANTAC) 75 MG tablet Take 75 mg by mouth 2 (two) times daily.     Past Month at Unknown time    Review of Systems  Constitutional: Negative for fever and malaise/fatigue.  Gastrointestinal: Positive for abdominal pain. Negative for constipation, diarrhea, nausea and vomiting.  Genitourinary: Negative for dysuria, frequency and urgency.       + spotting   Physical Exam   Blood pressure 118/59, pulse 76, temperature 98.4 F (36.9 C), temperature source Oral, resp. rate 20, height 5\' 4"  (1.626 m), weight 200 lb 4 oz (90.8 kg), last menstrual period 07/04/2016.  Physical Exam  Nursing note and vitals reviewed. Constitutional: She is oriented to person, place, and time. She appears well-developed and well-nourished. No distress.  HENT:  Head: Normocephalic and atraumatic.  Cardiovascular: Normal rate.   Respiratory: Effort normal.  GI: Soft. She exhibits no distension and no mass. There is no tenderness. There is no rebound and no guarding.  Genitourinary: Uterus is enlarged (slightly, exam limited by maternal  body habitus). Uterus is not tender. Cervix exhibits no motion tenderness, no discharge and no friability. Right adnexum displays no mass and no tenderness. Left adnexum displays no mass and no tenderness. No bleeding in the vagina. Vaginal discharge (scant thin, white discharge) found.  Genitourinary Comments: Cervix: closed, thick  Neurological: She is alert and oriented to person, place, and time.  Skin: Skin is warm and dry. No erythema.  Psychiatric: She has a normal mood and affect.    Results for orders placed or performed during the hospital encounter of 09/27/16 (from the past 24 hour(s))  Urinalysis, Routine w reflex microscopic ([facility])     Status: Abnormal   Collection Time: 09/27/16  8:14  PM  Result Value Ref Range   Color, Urine YELLOW YELLOW   APPearance CLEAR CLEAR   Specific Gravity, Urine 1.025 1.005 - 1.030   pH 6.0 5.0 - 8.0   Glucose, UA NEGATIVE NEGATIVE mg/dL   Hgb urine dipstick SMALL (A) NEGATIVE   Bilirubin Urine NEGATIVE NEGATIVE   Ketones, ur NEGATIVE NEGATIVE mg/dL   Protein, ur NEGATIVE NEGATIVE mg/dL   Nitrite NEGATIVE NEGATIVE   Leukocytes, UA SMALL (A) NEGATIVE  Urine microscopic-add on     Status: Abnormal   Collection Time: 09/27/16  8:14 PM  Result Value Ref Range   Squamous Epithelial / LPF 0-5 (A) NONE SEEN   WBC, UA 6-30 0 - 5 WBC/hpf   RBC / HPF NONE SEEN 0 - 5 RBC/hpf   Bacteria, UA FEW (A) NONE SEEN   Urine-Other MUCOUS PRESENT   Pregnancy, urine POC     Status: Abnormal   Collection Time: 09/27/16  8:19 PM  Result Value Ref Range   Preg Test, Ur POSITIVE (A) NEGATIVE  CBC with Differential/Platelet     Status: Abnormal   Collection Time: 09/27/16  9:01 PM  Result Value Ref Range   WBC 11.0 (H) 4.0 - 10.5 K/uL   RBC 4.26 3.87 - 5.11 MIL/uL   Hemoglobin 13.1 12.0 - 15.0 g/dL   HCT 16.137.9 09.636.0 - 04.546.0 %   MCV 89.0 78.0 - 100.0 fL   MCH 30.8 26.0 - 34.0 pg   MCHC 34.6 30.0 - 36.0 g/dL   RDW 40.912.8 81.111.5 - 91.415.5 %   Platelets 316 150 - 400 K/uL   Neutrophils Relative % 61 %   Neutro Abs 6.7 1.7 - 7.7 K/uL   Lymphocytes Relative 33 %   Lymphs Abs 3.6 0.7 - 4.0 K/uL   Monocytes Relative 5 %   Monocytes Absolute 0.5 0.1 - 1.0 K/uL   Eosinophils Relative 1 %   Eosinophils Absolute 0.1 0.0 - 0.7 K/uL   Basophils Relative 0 %   Basophils Absolute 0.0 0.0 - 0.1 K/uL   [place] Ob Comp Less 14 Wks  Result Date: 09/27/2016 CLINICAL DATA:  Spotting EXAM: OBSTETRIC <14 WK US AND TRANSVAGINAL OB US TECHNIQUE: Both transabdominal and transvaginal ultrasound examinations were performed for complete evaluation of the gestation as well as the maternal uterus, adnexal regions, and pelvic cul-de-sac. Transvaginal technique was performed to assess early  pregnancy. COMPARISON:  None. FINDINGS: Intrauterine gestational sac: Single intrauterine gestational sac. Yolk sac:  Visualized Embryo:  Visualized Cardiac Activity: Not visualized CRL:  2.7  mm   5 w   5 d                  US EDC: 05/25/2017 Subchorionic hemorrhage:  None visualized. Maternal uterus/adnexae: No focal mild atrial abnormalities.  Ovaries are grossly unremarkable. The right ovary measures 1.8 by 2.8 x 2 cm. The left ovary measures 1.4 x 2.3 x 1.1 cm. No free fluid. IMPRESSION: 1. Single intrauterine gestation identified, however no fetal cardiac activity is visualized. Findings are suspicious but not yet definitive for failed pregnancy. Recommend follow-up US in 10-14 days for definitive diagnosis. This recommendation follows SRU consensus guidelines: Diagnostic Criteria for Nonviable Pregnancy Early in the First Trimester. Malva Limes Med 2013; 161:0960-45. Electronically Signed   By: Jasmine Pang M.D.   On: 09/27/2016 21:42   US Ob Transvaginal  Result Date: 09/27/2016 CLINICAL DATA:  Spotting EXAM: OBSTETRIC <14 WK [place] AND TRANSVAGINAL OB US TECHNIQUE: Both transabdominal and transvaginal ultrasound examinations were performed for complete evaluation of the gestation as well as the maternal uterus, adnexal regions, and pelvic cul-de-sac. Transvaginal technique was performed to assess early pregnancy. COMPARISON:  None. FINDINGS: Intrauterine gestational sac: Single intrauterine gestational sac. Yolk sac:  Visualized Embryo:  Visualized Cardiac Activity: Not visualized CRL:  2.7  mm   5 w   5 d                  [place] EDC: 05/25/2017 Subchorionic hemorrhage:  None visualized. Maternal uterus/adnexae: No focal mild atrial abnormalities. Ovaries are grossly unremarkable. The right ovary measures 1.8 by 2.8 x 2 cm. The left ovary measures 1.4 x 2.3 x 1.1 cm. No free fluid. IMPRESSION: 1. Single intrauterine gestation identified, however no fetal cardiac activity is visualized. Findings are suspicious but  not yet definitive for failed pregnancy. Recommend follow-up US in 10-14 days for definitive diagnosis. This recommendation follows SRU consensus guidelines: Diagnostic Criteria for Nonviable Pregnancy Early in the First Trimester. Malva Limes Med 2013; 409:8119-14. Electronically Signed   By: Jasmine Pang M.D.   On: 09/27/2016 21:42    MAU Course  Procedures None  MDM +UPT Unable to obtain FHTs with doppler  CBC, [place] ordered O+ blood type in Epic from previous visit Discussed with patient concern for discrepancy in dating given sure LMP. Will schedule follow-up US in 1 week prior to first clinic appointment.  Assessment and Plan  A:  SIUP at [redacted]w[redacted]d without cardiac activity Vaginal bleeding in pregnancy prior to [redacted] weeks gestation   P:  Discharge home Bleeding precautions discussed Patient advised to follow-up with [facility] as scheduled.  Outpatient [place] ordered to confirm viability prior to follow-up appointment Patient may return to MAU as needed or if her condition were to change or worsen   Marny Lowenstein, PA-C  09/27/2016, 9:48 PM

## 2016-10-04 ENCOUNTER — Ambulatory Visit (HOSPITAL_COMMUNITY)
Admission: RE | Admit: 2016-10-04 | Discharge: 2016-10-04 | Disposition: A | Discharge status: Home / Self Care | Payer: Medicaid Other | Source: Ambulatory Visit | Attending: Medical | Admitting: Medical

## 2016-10-04 ENCOUNTER — Ambulatory Visit: Payer: Medicaid Other | Admitting: General Practice

## 2016-10-04 ENCOUNTER — Ambulatory Visit (INDEPENDENT_AMBULATORY_CARE_PROVIDER_SITE_OTHER): Payer: Medicaid Other | Admitting: Medical

## 2016-10-04 VITALS — BP 117/65 | HR 51 | Wt 198.0 lb

## 2016-10-04 DIAGNOSIS — Z3A01 Less than 8 weeks gestation of pregnancy: Secondary | ICD-10-CM | POA: Diagnosis not present

## 2016-10-04 DIAGNOSIS — O021 Missed abortion: Secondary | ICD-10-CM | POA: Diagnosis present

## 2016-10-04 DIAGNOSIS — O209 Hemorrhage in early pregnancy, unspecified: Secondary | ICD-10-CM | POA: Diagnosis not present

## 2016-10-04 MED ORDER — PROMETHAZINE HCL 12.5 MG PO TABS: 12.5000 mg | ORAL_TABLET | Freq: Four times a day (QID) | ORAL | 0 refills | Status: DC | PRN

## 2016-10-04 MED ORDER — IBUPROFEN 600 MG PO TABS: 600.0000 mg | ORAL_TABLET | Freq: Four times a day (QID) | ORAL | 1 refills | Status: DC | PRN

## 2016-10-04 MED ORDER — MISOPROSTOL 200 MCG PO TABS: ORAL_TABLET | ORAL | 1 refills | Status: DC

## 2016-10-04 MED ORDER — OXYCODONE-ACETAMINOPHEN 5-325 MG PO TABS: 1.0000 | ORAL_TABLET | ORAL | 0 refills | Status: DC | PRN

## 2016-10-04 NOTE — Progress Notes (Signed)
Patient reports some cramping this morning but nothing now

## 2016-10-04 NOTE — Patient Instructions (Signed)

## 2016-10-04 NOTE — Progress Notes (Signed)
Patient ID: Heather Christian, female   DOB: 1981/08/13, 35 y.o.   MRN: [id number]  Ultrasounds Results Note  SUBJECTIVE HPI:  Heather Christian is a 35 y.o. [id number] at 6651w5d by LMP who [facility] for followup ultrasound results. The patient denies abdominal pain or vaginal bleeding today.  Upon review of the patient's records, patient was first seen in MAU on 09/27/16 for spotting.   Ultrasound showed SIUP at 4524w5d without cardiac activity which was a significant discrepancy from LMP dating with sure LMP.  Repeat ultrasound was performed earlier today.   Past Medical History:  Diagnosis Date  . Abnormal Pap smear    LEEP 2008  . Overactive bladder    Past Surgical History:  Procedure Laterality Date  . INCONTINENCE SURGERY    . URETHRAL SLING    . WISDOM TOOTH EXTRACTION     Social History   Social History  . Marital status: Married    Spouse name: N/A  . Number of children: N/A  . Years of education: N/A   Occupational History  . Not on file.   Social History Main Topics  . Smoking status: Former Games developermoker  . Smokeless tobacco: Never Used  . Alcohol use No  . Drug use: No  . Sexual activity: Yes    Birth control/ protection: None   Other Topics Concern  . Not on file   Social History Narrative  . No narrative on file   Current Outpatient Prescriptions on File Prior to Visit  Medication Sig Dispense Refill  . acetaminophen (TYLENOL) 500 MG tablet Take 500 mg by mouth every 6 (six) hours as needed for moderate pain.    . fluticasone (FLONASE) 50 MCG/ACT nasal spray Place 1 spray into both nostrils daily as needed for allergies or rhinitis.    . Prenatal Vit-Fe Fumarate-FA (PRENATAL MULTIVITAMIN) TABS tablet Take 1 tablet by mouth daily at 12 noon.    . ranitidine (ZANTAC) 75 MG tablet Take 75 mg by mouth 2 (two) times daily.       No current facility-administered medications on file prior to visit.    Allergies  Allergen Reactions  .  Azithromycin   . Erythromycin     Severe stomach pain    I have reviewed patient's Past Medical Hx, Surgical Hx, Family Hx, Social Hx, medications and allergies.   Review of Systems Review of Systems  Constitutional: Negative for fever and chills.  Gastrointestinal: Negative for nausea, vomiting, abdominal pain, diarrhea and constipation.  Genitourinary: Negative for dysuria.  Musculoskeletal: Negative for back pain.  Neurological: Negative for dizziness and weakness.    Physical Exam  BP 117/65   Pulse (!) 51   Wt 198 lb (89.8 kg)   LMP 07/04/2016   BMI 33.99 kg/m   GENERAL: Well-developed, well-nourished female in no acute distress.  HEENT: Normocephalic, atraumatic.   LUNGS: Effort normal ABDOMEN: soft HEART: Regular rate  SKIN: Warm, dry and without erythema PSYCH: Normal mood and affect NEURO: Alert and oriented x 4  LAB RESULTS  IMAGING [place] Ob Comp Less 14 Wks  Result Date: 09/27/2016 CLINICAL DATA:  Spotting EXAM: OBSTETRIC <14 WK US AND TRANSVAGINAL OB US TECHNIQUE: Both transabdominal and transvaginal ultrasound examinations were performed for complete evaluation of the gestation as well as the maternal uterus, adnexal regions, and pelvic cul-de-sac. Transvaginal technique was performed to assess early pregnancy. COMPARISON:  None. FINDINGS: Intrauterine gestational sac: Single intrauterine gestational sac. Yolk sac:  Visualized Embryo:  Visualized Cardiac Activity: Not visualized CRL:  2.7  mm   5 w   5 d                  [place] EDC: 05/25/2017 Subchorionic hemorrhage:  None visualized. Maternal uterus/adnexae: No focal mild atrial abnormalities. Ovaries are grossly unremarkable. The right ovary measures 1.8 by 2.8 x 2 cm. The left ovary measures 1.4 x 2.3 x 1.1 cm. No free fluid. IMPRESSION: 1. Single intrauterine gestation identified, however no fetal cardiac activity is visualized. Findings are suspicious but not yet definitive for failed pregnancy. Recommend follow-up  US in 10-14 days for definitive diagnosis. This recommendation follows SRU consensus guidelines: Diagnostic Criteria for Nonviable Pregnancy Early in the First Trimester. Malva Limes Med 2013; 409:8119-14. Electronically Signed   By: Jasmine Pang M.D.   On: 09/27/2016 21:42   US Ob Transvaginal  Result Date: 10/04/2016 CLINICAL DATA:  Pregnant, vaginal bleeding EXAM: TRANSVAGINAL OB ULTRASOUND TECHNIQUE: Transvaginal ultrasound was performed for complete evaluation of the gestation as well as the maternal uterus, adnexal regions, and pelvic cul-de-sac. COMPARISON:  08/27/2016 FINDINGS: Intrauterine gestational sac: Single, mildly irregular Yolk sac:  Visualized. Embryo:  Visualized. Cardiac Activity: Not Visualized. CRL:   5.4  mm   6 w 1 d Subchorionic hemorrhage: Small subchronic hemorrhage, measuring 2.2 x 1.2 x 2.2 cm Maternal uterus/adnexae: Bilateral ovaries are within normal limits. Trace pelvic fluid in the bilateral adnexa. IMPRESSION: Single intrauterine gestation, measuring 6 weeks 1 day by crown-rump length, without cardiac activity. Findings meet definitive criteria for failed pregnancy. This follows SRU consensus guidelines: Diagnostic Criteria for Nonviable Pregnancy Early in the First Trimester. Macy Mis J Med [id number]. Electronically Signed   By: Charline Bills M.D.   On: 10/04/2016 09:49   US Ob Transvaginal  Result Date: 09/27/2016 CLINICAL DATA:  Spotting EXAM: OBSTETRIC <14 WK [place] AND TRANSVAGINAL OB US TECHNIQUE: Both transabdominal and transvaginal ultrasound examinations were performed for complete evaluation of the gestation as well as the maternal uterus, adnexal regions, and pelvic cul-de-sac. Transvaginal technique was performed to assess early pregnancy. COMPARISON:  None. FINDINGS: Intrauterine gestational sac: Single intrauterine gestational sac. Yolk sac:  Visualized Embryo:  Visualized Cardiac Activity: Not visualized CRL:  2.7  mm   5 w   5 d                  [place] EDC:  05/25/2017 Subchorionic hemorrhage:  None visualized. Maternal uterus/adnexae: No focal mild atrial abnormalities. Ovaries are grossly unremarkable. The right ovary measures 1.8 by 2.8 x 2 cm. The left ovary measures 1.4 x 2.3 x 1.1 cm. No free fluid. IMPRESSION: 1. Single intrauterine gestation identified, however no fetal cardiac activity is visualized. Findings are suspicious but not yet definitive for failed pregnancy. Recommend follow-up US in 10-14 days for definitive diagnosis. This recommendation follows SRU consensus guidelines: Diagnostic Criteria for Nonviable Pregnancy Early in the First Trimester. Malva Limes Med 2013; 784:6962-95. Electronically Signed   By: Jasmine Pang M.D.   On: 09/27/2016 21:42   MDM Discussed options of expectant management vs Cytotec vs D&C  Patient has decided to trial Cytotec.   Early Intrauterine Pregnancy Failure  _x__  Documented intrauterine pregnancy failure less than or equal to [redacted] weeks gestation  _x__  No serious current illness  _x__  Baseline Hgb greater than or equal to 10g/dl  _x__  Patient has easily accessible transportation to the hospital  _x__  Clear preference  _x__  Practitioner/physician  deems patient reliable  _x__  Counseling by practitioner or physician  _N/A__  Patient education by RN  _N/A__  Consent form signed  _N/A__  Rho-Gam given by RN if indicated  _x__ Medication dispensed   _x__   Cytotec 800 mcg  _x_   Intravaginally by patient at home         __   Intravaginally by RN in MAU        __   Rectally by patient at home        __   Rectally by RN in MAU  _x__  Ibuprofen 600 mg 1 tablet by mouth every 6 hours as needed #30  _x__  Hydrocodone/acetaminophen 5/325 mg by mouth every 4 to 6 hours as needed  _x__  Phenergan 12.5 mg by mouth every 4 hours as needed for nausea     ASSESSMENT 1. Missed abortion     PLAN Discharge home in stable condition Rx for Cytotec, Ibuprofen, Phenergan and Percocet given   Bleeding precautions discussed Patient advised to call [facility] if she does not have appropriate results from Cytotec after 2 doses to scheduled D&C Patient will return to [facility] in 2 weeks for follow-up and labs after SAB or sooner if symptoms change or worsen  Marny LowensteinJulie N Rip Hawes, PA-C  10/04/2016  10:38 AM

## 2016-10-05 ENCOUNTER — Telehealth: Payer: Self-pay

## 2016-10-05 NOTE — Telephone Encounter (Signed)
Pt called and stated that she received and placed 4 pills in her vagina around 5pm last night for a miscarriage.  Pt stated that when she went to the bathroom one of the pills fell out and she is wondering if because one of them fell out is the reason why she is not feeling anything?

## 2016-10-12 NOTE — Telephone Encounter (Signed)
Called patient, no answer- left message stating we are trying to reach you to check in and see how you are doing. Please give us call back. Patient has follow up appt scheduled 12/20

## 2016-10-20 ENCOUNTER — Encounter: Payer: Self-pay | Admitting: Student

## 2016-10-20 ENCOUNTER — Ambulatory Visit (INDEPENDENT_AMBULATORY_CARE_PROVIDER_SITE_OTHER): Payer: Medicaid Other | Admitting: Student

## 2016-10-20 VITALS — BP 111/65 | HR 78 | Wt 197.0 lb

## 2016-10-20 DIAGNOSIS — O039 Complete or unspecified spontaneous abortion without complication: Secondary | ICD-10-CM | POA: Insufficient documentation

## 2016-10-20 NOTE — Progress Notes (Signed)
   PRENATAL VISIT NOTE  Subjective:  Heather AcostaDeborah E Christian is a 35 y.o. [phone] being seen today following a SAB  On 12/4.  She took two courses of cytotec and began bleeding on 12/16. She passed several large clots and now her bleeding is light. Denies fever, pain, n/v and abdominal pain.   Objective:   Vitals:   10/20/16 0839  BP: 111/65  Pulse: 78  Weight: 197 lb (89.4 kg)    Fetal Status:           General:  Alert, oriented and cooperative. Patient is in no acute distress.  Skin: Skin is warm and dry. No rash noted.   Cardiovascular: Normal heart rate noted  Respiratory: Normal respiratory effort, no problems with respiration noted  Abdomen:   Pelvic:          Extremities: Normal range of motion.     Mental Status: Normal mood and affect. Normal behavior. Normal judgment and thought content.   Assessment and Plan:   1. SAB (spontaneous abortion) Wants to continue trying to get pregnant, encouraged her to continue with prenatal vitamins.  - hCG, quantitative, pregnancy today Please refer to After Visit Summary for other counseling recommendations.  No Follow-up on file.   Marylene LandKathryn Lorraine Kooistra, CNM

## 2016-10-21 LAB — HCG, QUANTITATIVE, PREGNANCY: HCG, BETA CHAIN, QUANT, S: 359 m[IU]/mL — AB

## 2016-11-01 NOTE — L&D Delivery Note (Signed)
Patient is a 36 y.o. now [id number] s/p NSVD at 955w5d, who was admitted for SOL.  Delivery Note At 6:14 AM a viable female was delivered via Vaginal, Spontaneous Delivery (Presentation: OA;).  APGAR: 9, 9; weight pending .   Placenta status: intact, 3-vessel.  Cord:  with the following complications: none.   Anesthesia:  Epidural Episiotomy: None Lacerations: None Suture Repair: n/a Est. Blood Loss (mL): 100  Mom to postpartum.  Baby to Couplet care / Skin to Skin.  Heather Christian 09/03/2017, 6:41 AM  Head delivered OA. No nuchal cord present. Shoulder and body delivered in usual fashion. Infant with spontaneous cry, placed on mother's abdomen, dried and bulb suctioned. Cord clamped x 2 after 1-minute delay, and cut by family member. Cord blood drawn. Placenta delivered spontaneously with gentle cord traction. Fundus firm with massage and Pitocin. Perineum inspected and found to have bilateral periuretheral lacerations, which was found to be hemostatic.  Heather Schickim Sharri Loya, DO PGY-1, [facility] 09/03/17, 6:41 AM

## 2017-02-06 ENCOUNTER — Inpatient Hospital Stay (HOSPITAL_COMMUNITY)
Admission: AD | Admit: 2017-02-06 | Discharge: 2017-02-06 | Disposition: A | Discharge status: Home / Self Care | Payer: Medicaid Other | Source: Ambulatory Visit | Attending: Obstetrics & Gynecology | Admitting: Obstetrics & Gynecology

## 2017-02-06 ENCOUNTER — Encounter (HOSPITAL_COMMUNITY): Payer: Self-pay

## 2017-02-06 DIAGNOSIS — Z3A13 13 weeks gestation of pregnancy: Secondary | ICD-10-CM | POA: Insufficient documentation

## 2017-02-06 DIAGNOSIS — Z79899 Other long term (current) drug therapy: Secondary | ICD-10-CM | POA: Diagnosis not present

## 2017-02-06 DIAGNOSIS — O210 Mild hyperemesis gravidarum: Secondary | ICD-10-CM | POA: Diagnosis not present

## 2017-02-06 DIAGNOSIS — R1030 Lower abdominal pain, unspecified: Secondary | ICD-10-CM | POA: Diagnosis not present

## 2017-02-06 DIAGNOSIS — O9989 Other specified diseases and conditions complicating pregnancy, childbirth and the puerperium: Secondary | ICD-10-CM | POA: Diagnosis not present

## 2017-02-06 DIAGNOSIS — Z87891 Personal history of nicotine dependence: Secondary | ICD-10-CM | POA: Diagnosis not present

## 2017-02-06 DIAGNOSIS — O26891 Other specified pregnancy related conditions, first trimester: Secondary | ICD-10-CM | POA: Insufficient documentation

## 2017-02-06 DIAGNOSIS — R109 Unspecified abdominal pain: Secondary | ICD-10-CM | POA: Diagnosis present

## 2017-02-06 DIAGNOSIS — R102 Pelvic and perineal pain: Secondary | ICD-10-CM | POA: Diagnosis not present

## 2017-02-06 LAB — URINALYSIS, ROUTINE W REFLEX MICROSCOPIC
Bilirubin Urine: NEGATIVE
GLUCOSE, UA: NEGATIVE mg/dL
Hgb urine dipstick: NEGATIVE
KETONES UR: NEGATIVE mg/dL
LEUKOCYTES UA: NEGATIVE
NITRITE: NEGATIVE
PROTEIN: NEGATIVE mg/dL
Specific Gravity, Urine: 1.027 (ref 1.005–1.030)
pH: 5 (ref 5.0–8.0)

## 2017-02-06 LAB — POCT PREGNANCY, URINE: Preg Test, Ur: POSITIVE — AB

## 2017-02-06 NOTE — Discharge Instructions (Signed)

## 2017-02-06 NOTE — MAU Note (Signed)
Pt having some lower abdominal pain for several days. 7/10. No bleeding but spotting two weeks ago.

## 2017-02-06 NOTE — MAU Provider Note (Signed)
Patient Heather Christian is a 36 year old G7P0042 at 13 weeks by certain LMP here with complaints with sharp abdominal pain on Friday after lifting heavy things at work. She is very anxious because the sharp pains that she had are similar to when she had her miscarriage in the past.  History     CSN: [id number]  Arrival date and time: 02/06/17 8469   First Provider Initiated Contact with Patient 02/06/17 [phone]      Chief Complaint  Patient presents with  . Abdominal Pain   Abdominal Pain  This is a new problem. The current episode started in the past 7 days. The onset quality is sudden. Episode frequency: She does a lot of heavy lifting at work and by 6 pm on Friday she had a lot of lower abdominal pain. The pain is located in the periumbilical region. The pain is at a severity of 7/10. The abdominal pain does not radiate. Associated symptoms include nausea. Associated symptoms comments: Has had morning sickness for the past 3 weeks. The pain is aggravated by certain positions. Relieved by: pain relieved after resting yesterday.    OB History    Gravida Para Term Preterm AB Living   SAB TAB Ectopic Multiple Live Births   4       2      Past Medical History:  Diagnosis Date  . Abnormal Pap smear    LEEP 2008  . Overactive bladder     Past Surgical History:  Procedure Laterality Date  . INCONTINENCE SURGERY    . URETHRAL SLING    . WISDOM TOOTH EXTRACTION      Family History  Problem Relation Age of Onset  . Nephrolithiasis Mother   . Alcohol abuse Father     recovering  . Hypertension Father   . Heart disease Father     heart attack    Social History  Substance Use Topics  . Smoking status: Former Games developer  . Smokeless tobacco: Never Used  . Alcohol use No    Allergies:  Allergies  Allergen Reactions  . Azithromycin Other (See Comments)    Reaction:  Abdominal pain   . Erythromycin Other (See Comments)    Reaction:  Abdominal pain      Prescriptions Prior to Admission  Medication Sig Dispense Refill Last Dose  . acetaminophen (TYLENOL) 325 MG tablet Take 650 mg by mouth every 6 (six) hours as needed for mild pain, moderate pain, fever or headache.   Past Week at Unknown time  . calcium carbonate (TUMS - DOSED IN MG ELEMENTAL CALCIUM) 500 MG chewable tablet Chew 2-3 tablets by mouth every 2 (two) hours as needed for indigestion or heartburn.   02/06/2017 at Unknown time  . cetirizine (ZYRTEC) 10 MG tablet Take 10 mg by mouth daily as needed for allergies.   Past Week at Unknown time  . Docosahexaenoic Acid (DHA PO) Take 1 capsule by mouth daily.   02/06/2017 at Unknown time  . fluticasone (FLONASE) 50 MCG/ACT nasal spray Place 1-2 sprays into both nostrils daily as needed for rhinitis.    02/05/2017 at Unknown time  . Prenatal Vit-Fe Fumarate-FA (PRENATAL MULTIVITAMIN) TABS tablet Take 1 tablet by mouth daily.    02/06/2017 at Unknown time  . ranitidine (ZANTAC) 150 MG tablet Take 150 mg by mouth 2 (two) times daily as needed for heartburn.   02/06/2017 at Unknown time    Review of  Systems  Respiratory: Negative.   Cardiovascular: Negative.   Gastrointestinal: Positive for abdominal pain and nausea.  Genitourinary: Negative.   Musculoskeletal: Negative.    Physical Exam   Blood pressure 123/77, pulse 73, temperature 97.8 F (36.6 C), temperature source Oral, resp. rate 16, last menstrual period 11/07/2016, unknown if currently breastfeeding.  Physical Exam  Constitutional: She is oriented to person, place, and time. She appears well-developed.  HENT:  Head: Normocephalic.  Neck: Normal range of motion.  Respiratory: Effort normal.  GI: Soft.  Cardiac activity verified by bedside US (performed by [facility]).   Musculoskeletal: Normal range of motion.  Neurological: She is alert and oriented to person, place, and time.  Skin: Skin is warm and dry.    MAU Course  Procedures  MDM -Cultures and bimanual deferred;   Beside US shows cardiac activity; patient become very emotional upon seeing heartbeat. Reassured patient that heavy lifting at work would not cause miscarriage but is the most likely explanation for her abdominal pain.    Assessment and Plan   1. Pregnancy with suprapubic pain, antepartum    2. Patient stable for discharge; number for [facility] site given and patient plans to call to make prenatal appointment this week.  3. Given information on B6 and Unisom for nausea.  Charlesetta Garibaldi Darrold Bezek CNM 02/06/2017, 9:42 AM

## 2017-02-25 ENCOUNTER — Other Ambulatory Visit (HOSPITAL_COMMUNITY)
Admission: RE | Admit: 2017-02-25 | Discharge: 2017-02-25 | Disposition: A | Discharge status: Home / Self Care | Payer: Medicaid Other | Source: Ambulatory Visit | Attending: Obstetrics and Gynecology | Admitting: Obstetrics and Gynecology

## 2017-02-25 ENCOUNTER — Ambulatory Visit (INDEPENDENT_AMBULATORY_CARE_PROVIDER_SITE_OTHER): Payer: Medicaid Other | Admitting: Obstetrics and Gynecology

## 2017-02-25 ENCOUNTER — Encounter: Payer: Self-pay | Admitting: Obstetrics and Gynecology

## 2017-02-25 DIAGNOSIS — Z3A12 12 weeks gestation of pregnancy: Secondary | ICD-10-CM | POA: Insufficient documentation

## 2017-02-25 DIAGNOSIS — E669 Obesity, unspecified: Secondary | ICD-10-CM

## 2017-02-25 DIAGNOSIS — O0991 Supervision of high risk pregnancy, unspecified, first trimester: Secondary | ICD-10-CM | POA: Insufficient documentation

## 2017-02-25 DIAGNOSIS — O09519 Supervision of elderly primigravida, unspecified trimester: Secondary | ICD-10-CM | POA: Insufficient documentation

## 2017-02-25 DIAGNOSIS — O99210 Obesity complicating pregnancy, unspecified trimester: Secondary | ICD-10-CM

## 2017-02-25 DIAGNOSIS — O0992 Supervision of high risk pregnancy, unspecified, second trimester: Secondary | ICD-10-CM | POA: Insufficient documentation

## 2017-02-25 DIAGNOSIS — O09522 Supervision of elderly multigravida, second trimester: Secondary | ICD-10-CM | POA: Diagnosis not present

## 2017-02-25 DIAGNOSIS — N879 Dysplasia of cervix uteri, unspecified: Secondary | ICD-10-CM

## 2017-02-25 DIAGNOSIS — O0990 Supervision of high risk pregnancy, unspecified, unspecified trimester: Secondary | ICD-10-CM | POA: Insufficient documentation

## 2017-02-25 DIAGNOSIS — O09523 Supervision of elderly multigravida, third trimester: Secondary | ICD-10-CM | POA: Insufficient documentation

## 2017-02-25 DIAGNOSIS — Z9889 Other specified postprocedural states: Secondary | ICD-10-CM | POA: Insufficient documentation

## 2017-02-25 MED ORDER — ASPIRIN EC 81 MG PO TBEC: 81.0000 mg | DELAYED_RELEASE_TABLET | Freq: Every day | ORAL | 3 refills | Status: DC

## 2017-02-26 ENCOUNTER — Encounter: Payer: Self-pay | Admitting: Obstetrics and Gynecology

## 2017-02-26 DIAGNOSIS — E66812 Obesity, class 2: Secondary | ICD-10-CM | POA: Insufficient documentation

## 2017-02-26 DIAGNOSIS — E669 Obesity, unspecified: Secondary | ICD-10-CM | POA: Insufficient documentation

## 2017-02-26 DIAGNOSIS — O99210 Obesity complicating pregnancy, unspecified trimester: Secondary | ICD-10-CM | POA: Insufficient documentation

## 2017-02-26 NOTE — Progress Notes (Signed)
New OB Note  02/25/2017   Clinic: [facility]  Chief Complaint: NOB  Transfer of Care Patient: no  History of Present Illness: Heather Christian is a 36 y.o. [id number] @ 12/4 weeks (EDC 11/5, based on 12wk u/s today).  Patient's last menstrual period was 11/07/2016.).  Preg complicated by has Cervical dysplasia; Supervision of high risk pregnancy, antepartum; History of loop electrical excision procedure (LEEP); AMA (advanced maternal age) multigravida 35+, second trimester; Obesity, Class II, BMI 35-39.9; and Obesity in pregnancy on her problem list.  Any events prior to today's visit: no She was using no method when she conceived.  She has Positive signs or symptoms of nausea/vomiting of pregnancy, but able to stay hydrated and keep down foods.  She has Negative signs or symptoms of miscarriage or preterm labor On any different medications around the time she conceived/early pregnancy: No   ROS: A 12-point review of systems was performed and negative, except as stated in the above HPI.  OBGYN History: As per HPI. OB History  Gravida Para Term Preterm AB Living  SAB TAB Ectopic Multiple Live Births  4       2    # Outcome Date GA Lbr Len/2nd Weight Sex Delivery Anes PTL Lv  7 Current           6 SAB 10/30/16 [redacted]w[redacted]d         5 Term           4 Term           3 SAB           2 SAB           1 SAB             Obstetric Comments  G1 and G2: early SAB  G3: 09/2002 6lbs SVD  G4: SAB  G5: 01/2008 7lbs 7oz SVD  G6: 10/2016 early SAB   Any issues with any prior pregnancies: no Prior children are healthy, doing well, and without any problems or issues: yes History of pap smears:  06/2014 NILM 04/2013 NILM/hpv neg 2012 NILM/hpv neg   Past Medical History: Past Medical History:  Diagnosis Date  . Abnormal Pap smear    LEEP 2008  . Overactive bladder     Past Surgical History: Past Surgical History:  Procedure Laterality Date  .  INCONTINENCE SURGERY    . LEEP    . URETHRAL SLING    . WISDOM TOOTH EXTRACTION      Family History:  Family History  Problem Relation Age of Onset  . Nephrolithiasis Mother   . Alcohol abuse Father     recovering  . Hypertension Father   . Heart disease Father     heart attack    She denies any history of mental retardation, birth defects or genetic disorders in her or the FOB's history  Social History:  Social History   Social History  . Marital status: Married    Spouse name: N/A  . Number of children: N/A  . Years of education: N/A   Occupational History  . Not on file.   Social History Main Topics  . Smoking status: Former Games developer  . Smokeless tobacco: Never Used  . Alcohol use No  . Drug use: No  . Sexual activity: Yes    Birth control/ protection: None   Other Topics Concern  . Not on file   Social History  Narrative  . No narrative on file    Allergy: Allergies  Allergen Reactions  . Azithromycin Other (See Comments)    Reaction:  Abdominal pain   . Erythromycin Other (See Comments)    Reaction:  Abdominal pain     Current Outpatient Medications: PNV, zyrtec, tums  Physical Exam:   BP 104/72   Pulse 78   Wt 202 lb (91.6 kg)   LMP 11/07/2016   BMI 34.67 kg/m  Body mass index is 34.67 kg/m. Contractions: Not present Vag. Bleeding: None. FHTs: 150s by bedside u/s  General appearance: Well nourished, well developed female in no acute distress.  Neck:  Supple, normal appearance, and no thyromegaly  Cardiovascular: S1, S2 normal, no murmur, rub or gallop, regular rate and rhythm Respiratory:  Clear to auscultation bilateral. Normal respiratory effort Abdomen: positive bowel sounds and no masses, hernias; diffusely non tender to palpation, non distended Breasts: breasts appear normal, no suspicious masses, no skin or nipple changes or axillary nodes, and negative palpation. . Neuro/Psych:  Normal mood and affect.  Skin:  Warm and dry.   Lymphatic:  No inguinal lymphadenopathy.   Pelvic exam: is not limited by body habitus EGBUS: within normal limits, Vagina: within normal limits and with no blood in the vault, Cervix: normal appearing cervix without discharge or lesions, closed/long/high, Uterus:  enlarged, c/w 12-14 week size, and Adnexa:  normal adnexa and no mass, fullness, tenderness  Laboratory: none  Imaging:  SLIUP with CRL c/w 12/4 weeks, subj normal AF.   Assessment: pt doing well  Plan: 1. Supervision of high risk pregnancy, antepartum Routine care.  - Obstetric Panel, Including HIV - Urine Culture - Cervicovaginal ancillary only - Hepatitis c antibody (reflex) - [place] MFM OB COMP + 14 WK; Future  2. History of loop electrical excision procedure (LEEP) Pt states she had her LEEP <1 year prior to her last child and was followed with cervical lengths b/c of that but there were no problems or issues. Will get baseline TVUS CL at 16wks and f/u with TAUS at 18wks and order rpts PRNs. Pap updated today.  - [place] MFM OB Transvaginal; Future  3. AMA (advanced maternal age) multigravida 35+, second trimester Pt amenable to formal genetic counseling. Pt told to start baby ASA in 1-2wks. Will get baseline a1c, baseline pre-x, tsh labs  - Comprehensive metabolic panel - Hemoglobin A1c - TSH - Protein / creatinine ratio, urine - AMB MFM GENETICS REFERRAL  Problem list reviewed and updated.  Follow up in 3 weeks.  The nature of  - [facility] with multiple MDs and other Advanced Practice Providers was explained to patient; also emphasized that residents, students are part of our team.  >50% of 30 min visit spent on counseling and coordination of care.     Heather Copa MD Attending Center for [facility])

## 2017-02-27 LAB — COMPREHENSIVE METABOLIC PANEL
A/G RATIO: 1.2 (ref 1.2–2.2)
ALBUMIN: 3.4 g/dL — AB (ref 3.5–5.5)
ALT: 14 IU/L (ref 0–32)
AST: 12 IU/L (ref 0–40)
Alkaline Phosphatase: 60 IU/L (ref 39–117)
BUN / CREAT RATIO: 11 (ref 9–23)
BUN: 6 mg/dL (ref 6–20)
Bilirubin Total: <0.2 mg/dL (ref 0.0–1.2)
CALCIUM: 9 mg/dL (ref 8.7–10.2)
CO2: 21 mmol/L (ref 18–29)
Chloride: 102 mmol/L (ref 96–106)
Creatinine, Ser: 0.53 mg/dL — ABNORMAL LOW (ref 0.57–1.00)
GFR calc Af Amer: 140 mL/min/{1.73_m2} (ref >59)
GFR, EST NON AFRICAN AMERICAN: 122 mL/min/{1.73_m2} (ref >59)
GLOBULIN, TOTAL: 2.9 g/dL (ref 1.5–4.5)
Glucose: 88 mg/dL (ref 65–99)
POTASSIUM: 3.5 mmol/L (ref 3.5–5.2)
SODIUM: 139 mmol/L (ref 134–144)
Total Protein: 6.3 g/dL (ref 6.0–8.5)

## 2017-02-27 LAB — HCV COMMENT:

## 2017-02-27 LAB — OBSTETRIC PANEL, INCLUDING HIV
Antibody Screen: NEGATIVE
Basophils Absolute: 0 10*3/uL (ref 0.0–0.2)
Basos: 0 %
EOS (ABSOLUTE): 0.1 10*3/uL (ref 0.0–0.4)
Eos: 1 %
HEMATOCRIT: 37.3 % (ref 34.0–46.6)
HEP B S AG: NEGATIVE
HIV SCREEN 4TH GENERATION: NONREACTIVE
Hemoglobin: 12.7 g/dL (ref 11.1–15.9)
Immature Grans (Abs): 0 10*3/uL (ref 0.0–0.1)
Immature Granulocytes: 0 %
LYMPHS ABS: 2.2 10*3/uL (ref 0.7–3.1)
Lymphs: 28 %
MCH: 29.6 pg (ref 26.6–33.0)
MCHC: 34 g/dL (ref 31.5–35.7)
MCV: 87 fL (ref 79–97)
Monocytes Absolute: 0.8 10*3/uL (ref 0.1–0.9)
Monocytes: 10 %
NEUTROS ABS: 4.7 10*3/uL (ref 1.4–7.0)
Neutrophils: 61 %
Platelets: 309 10*3/uL (ref 150–379)
RBC: 4.29 x10E6/uL (ref 3.77–5.28)
RDW: 13.8 % (ref 12.3–15.4)
RH TYPE: POSITIVE
RPR: NONREACTIVE
Rubella Antibodies, IGG: 2.01 index (ref >0.99)
WBC: 7.8 10*3/uL (ref 3.4–10.8)

## 2017-02-27 LAB — HGB A1C W/O EAG: Hgb A1c MFr Bld: 5.5 % (ref 4.8–5.6)

## 2017-02-27 LAB — PROTEIN / CREATININE RATIO, URINE
Creatinine, Urine: 227.9 mg/dL
PROTEIN UR: 18.8 mg/dL
Protein/Creat Ratio: 82 mg/g creat (ref 0–200)

## 2017-02-27 LAB — URINE CULTURE

## 2017-02-27 LAB — HEPATITIS C ANTIBODY (REFLEX)

## 2017-02-27 LAB — TSH: TSH: 1.09 u[IU]/mL (ref 0.450–4.500)

## 2017-02-28 ENCOUNTER — Encounter: Payer: Self-pay | Admitting: *Deleted

## 2017-02-28 LAB — CERVICOVAGINAL ANCILLARY ONLY
Chlamydia: NEGATIVE
NEISSERIA GONORRHEA: NEGATIVE

## 2017-03-01 NOTE — Addendum Note (Signed)
Addended by: Arne Cleveland on: 03/01/2017 01:06 PM   Modules accepted: Orders

## 2017-03-02 ENCOUNTER — Ambulatory Visit (HOSPITAL_COMMUNITY)
Admission: RE | Admit: 2017-03-02 | Discharge: 2017-03-02 | Disposition: A | Discharge status: Home / Self Care | Payer: Medicaid Other | Source: Ambulatory Visit | Attending: Obstetrics and Gynecology | Admitting: Obstetrics and Gynecology

## 2017-03-02 DIAGNOSIS — O09521 Supervision of elderly multigravida, first trimester: Secondary | ICD-10-CM | POA: Insufficient documentation

## 2017-03-02 DIAGNOSIS — O09522 Supervision of elderly multigravida, second trimester: Secondary | ICD-10-CM

## 2017-03-02 DIAGNOSIS — Z3A13 13 weeks gestation of pregnancy: Secondary | ICD-10-CM | POA: Diagnosis not present

## 2017-03-02 NOTE — Progress Notes (Signed)
Appointment Date: 03/02/2017 DOB: October 22, 1981 Referring Provider: George Bing, MD Attending: Dr. Particia Nearing  [name] was seen for genetic counseling because of a maternal age of 36 y.o..     In summary:  Discussed AMA and associated risk for fetal aneuploidy  Discussed options for screening  Quad screen  NIPS - drawn today  Ultrasound - scheduled for appropriate gestation  Discussed diagnostic testing options  Amniocentesis - declined  Reviewed family history concerns - none reported  Discussed carrier screening options - declined  CF  SMA  Hemoglobinopathies  She was counseled regarding maternal age and the association with risk for chromosome conditions due to nondisjunction with aging of the ova.   We reviewed chromosomes, nondisjunction, and the associated 1 in 44 risk for fetal aneuploidy related to a maternal age of 36 y.o. at [redacted]w[redacted]d gestation.  She was counseled that the risk for aneuploidy decreases as gestational age increases, accounting for those pregnancies which spontaneously abort.  We specifically discussed Down syndrome (trisomy 50), trisomies 12 and 67, and sex chromosome aneuploidies (47,XXX and 47,XXY) including the common features and prognoses of each.   We reviewed available screening options including Quad screen, noninvasive prenatal screening (NIPS)/cell free DNA (cfDNA) screening, and detailed ultrasound.  She was counseled that screening tests are used to modify a patient's a priori risk for aneuploidy, typically based on age. This estimate provides a pregnancy specific risk assessment. We reviewed the benefits and limitations of each option. Specifically, we discussed the conditions for which each test screens, the detection rates, and false positive rates of each. She was also counseled regarding diagnostic testing via amniocentesis. We reviewed the approximate 1 in 300-500 risk for complications from amniocentesis, including  spontaneous pregnancy loss. We discussed the possible results that the tests might provide including: positive, negative, unanticipated, and no result. Finally, she was counseled regarding the cost of each option and potential out of pocket expenses.  After consideration of all the options, she elected to proceed with NIPS.  Those results will be available in 8-10 days.    [name] was provided with written information regarding cystic fibrosis (CF), spinal muscular atrophy (SMA) and hemoglobinopathies including the carrier frequency, availability of carrier screening and prenatal diagnosis if indicated.  In addition, we discussed that CF, SMA and hemoglobinopathies are routinely screened for as part of the  newborn screening panel.  After further discussion, she declined screening for CF, SMA and hemoglobinopathies.  Both family histories were reviewed and found to be noncontributory for birth defects, intellectual disability, and known genetic conditions. Without further information regarding the provided family history, an accurate genetic risk cannot be calculated. Further genetic counseling is warranted if more information is obtained.  [name] denied exposure to environmental toxins or chemical agents. She denied the use of alcohol, tobacco or street drugs. She denied significant viral illnesses during the course of her pregnancy. Her medical and surgical histories were noncontributory.   I counseled [name] regarding the above risks and available options.  The approximate face-to-face time with the genetic counselor was 45 minutes.  Mady Gemma, MS,  Certified Genetic Counselor

## 2017-03-03 LAB — CYTOLOGY - PAP
Diagnosis: NEGATIVE
HPV: NOT DETECTED

## 2017-03-08 ENCOUNTER — Other Ambulatory Visit: Payer: Self-pay

## 2017-03-16 ENCOUNTER — Telehealth (HOSPITAL_COMMUNITY): Payer: Self-pay | Admitting: Genetics

## 2017-03-16 ENCOUNTER — Ambulatory Visit (INDEPENDENT_AMBULATORY_CARE_PROVIDER_SITE_OTHER): Payer: Medicaid Other | Admitting: Family Medicine

## 2017-03-16 VITALS — BP 109/70 | HR 78 | Wt 202.0 lb

## 2017-03-16 DIAGNOSIS — M549 Dorsalgia, unspecified: Secondary | ICD-10-CM

## 2017-03-16 DIAGNOSIS — O09522 Supervision of elderly multigravida, second trimester: Secondary | ICD-10-CM

## 2017-03-16 DIAGNOSIS — O0992 Supervision of high risk pregnancy, unspecified, second trimester: Secondary | ICD-10-CM

## 2017-03-16 DIAGNOSIS — O99210 Obesity complicating pregnancy, unspecified trimester: Secondary | ICD-10-CM

## 2017-03-16 DIAGNOSIS — O26892 Other specified pregnancy related conditions, second trimester: Secondary | ICD-10-CM

## 2017-03-16 DIAGNOSIS — O9989 Other specified diseases and conditions complicating pregnancy, childbirth and the puerperium: Secondary | ICD-10-CM

## 2017-03-16 DIAGNOSIS — O99212 Obesity complicating pregnancy, second trimester: Secondary | ICD-10-CM

## 2017-03-16 DIAGNOSIS — E669 Obesity, unspecified: Secondary | ICD-10-CM

## 2017-03-16 DIAGNOSIS — O0990 Supervision of high risk pregnancy, unspecified, unspecified trimester: Secondary | ICD-10-CM

## 2017-03-16 DIAGNOSIS — K219 Gastro-esophageal reflux disease without esophagitis: Secondary | ICD-10-CM

## 2017-03-16 MED ORDER — PREDNISONE 20 MG PO TABS: ORAL_TABLET | ORAL | 0 refills | Status: DC

## 2017-03-16 MED ORDER — OMEPRAZOLE 20 MG PO CPDR: 20.0000 mg | DELAYED_RELEASE_CAPSULE | Freq: Every day | ORAL | 2 refills | Status: DC

## 2017-03-16 NOTE — Progress Notes (Signed)
   PRENATAL VISIT NOTE  Subjective:  Heather Christian is a 36 y.o. [id number] at 7253w2d being seen today for ongoing prenatal care.  She is currently monitored for the following issues for this high-risk pregnancy and has Cervical dysplasia; Supervision of high risk pregnancy, antepartum; History of loop electrical excision procedure (LEEP); Advanced maternal age in multigravida, second trimester; Obesity, Class II, BMI 35-39.9; and Obesity in pregnancy on her problem list.  Patient reports backache and heartburn.  Contractions: Not present. Vag. Bleeding: None.  Movement: Absent. Denies leaking of fluid.   The following portions of the patient's history were reviewed and updated as appropriate: allergies, current medications, past family history, past medical history, past social history, past surgical history and problem list. Problem list updated.  Objective:   Vitals:   03/16/17 1131  BP: 109/70  Pulse: 78  Weight: 202 lb (91.6 kg)    Fetal Status: Fetal Heart Rate (bpm): 140 Fundal Height: 15 cm Movement: Absent     General:  Alert, oriented and cooperative. Patient is in no acute distress.  Skin: Skin is warm and dry. No rash noted.   Cardiovascular: Normal heart rate noted  Respiratory: Normal respiratory effort, no problems with respiration noted  Abdomen: Soft, gravid, appropriate for gestational age. Pain/Pressure: Present     Pelvic:  Cervical exam deferred        Extremities: Normal range of motion.  Edema: Trace  Mental Status: Normal mood and affect. Normal behavior. Normal judgment and thought content.   Assessment and Plan:  Pregnancy: [id number] at 3253w2d  1. Supervision of high risk pregnancy, antepartum UTD Anatomy scan scheduled  2. Advanced maternal age in multigravida, second trimester Panorama pending  3. Obesity in pregnancy Reviewed weight gain  4. Back pain affecting pregnancy in second trimester -Has known nerve compression, prior steroid treatment was  effective.  - Reviewed footwear and stretches to prevent back pain.  - predniSONE (DELTASONE) 20 MG tablet; Take 2 tablets daily for 3 days, then 1 tablet daily for 3 days then 1/2 tablet daily for 3 day  Dispense: 17 tablet; Refill: 0  5. Gastroesophageal reflux disease, esophagitis presence not specified Worsening pregnancy, using both TUMS and ranitidine at max doses. - omeprazole (PRILOSEC) 20 MG capsule; Take 1 capsule (20 mg total) by mouth daily.  Dispense: 30 capsule; Refill: 2 - If still having sx plan to increase to 40 mg daily.   Preterm labor symptoms and general obstetric precautions including but not limited to vaginal bleeding, contractions, leaking of fluid and fetal movement were reviewed in detail with the patient. Please refer to After Visit Summary for other counseling recommendations.  Return in about 4 weeks (around 04/13/2017) for Routine prenatal care.   Federico FlakeNewton, Lucien Budney Niles, MD

## 2017-03-16 NOTE — Telephone Encounter (Signed)
Called Heather AcostaDeborah E Edman to discuss her prenatal cell free DNA test results.  Mrs. Heather Acostaeborah E Christian had Panorama testing through [facility].  Testing was offered because of AMA.   The patient was identified by name and DOB.  We reviewed that these are within normal limits, showing a less than 1 in 10,000 risk for trisomies 21, 18 and 13, and monosomy X (Turner syndrome).  In addition, the risk for triploidy and sex chromosome trisomies (47,XXX and 47,XXY) was also low risk.    We reviewed that this testing identifies > 99% of pregnancies with trisomy 1821, trisomy 4813, sex chromosome trisomies (47,XXX and 47,XXY), and triploidy. The detection rate for trisomy 18 is 96%.  The detection rate for monosomy X is ~92%.  The false positive rate is <0.1% for all conditions. The patient did not wish to know fetal sex.  She understands that this testing does not identify all genetic conditions.  All questions were answered to her satisfaction, she was encouraged to call with additional questions or concerns.  Heather Gemmaaragh Jamario Colina, MS Certified Genetic Counselor

## 2017-03-23 ENCOUNTER — Ambulatory Visit (HOSPITAL_COMMUNITY)
Admission: RE | Admit: 2017-03-23 | Discharge: 2017-03-23 | Disposition: A | Discharge status: Home / Self Care | Payer: Medicaid Other | Source: Ambulatory Visit | Attending: Obstetrics and Gynecology | Admitting: Obstetrics and Gynecology

## 2017-03-23 ENCOUNTER — Encounter (HOSPITAL_COMMUNITY): Payer: Self-pay

## 2017-03-23 DIAGNOSIS — Z3A16 16 weeks gestation of pregnancy: Secondary | ICD-10-CM | POA: Insufficient documentation

## 2017-03-23 DIAGNOSIS — O09522 Supervision of elderly multigravida, second trimester: Secondary | ICD-10-CM | POA: Insufficient documentation

## 2017-03-23 DIAGNOSIS — O09299 Supervision of pregnancy with other poor reproductive or obstetric history, unspecified trimester: Secondary | ICD-10-CM | POA: Insufficient documentation

## 2017-03-23 DIAGNOSIS — Z9889 Other specified postprocedural states: Secondary | ICD-10-CM | POA: Insufficient documentation

## 2017-03-23 DIAGNOSIS — Z6835 Body mass index (BMI) 35.0-35.9, adult: Secondary | ICD-10-CM | POA: Insufficient documentation

## 2017-03-23 DIAGNOSIS — O0990 Supervision of high risk pregnancy, unspecified, unspecified trimester: Secondary | ICD-10-CM

## 2017-03-23 DIAGNOSIS — O0992 Supervision of high risk pregnancy, unspecified, second trimester: Secondary | ICD-10-CM | POA: Insufficient documentation

## 2017-03-23 DIAGNOSIS — O99212 Obesity complicating pregnancy, second trimester: Secondary | ICD-10-CM | POA: Insufficient documentation

## 2017-04-13 ENCOUNTER — Other Ambulatory Visit: Payer: Self-pay | Admitting: Obstetrics and Gynecology

## 2017-04-13 ENCOUNTER — Ambulatory Visit (INDEPENDENT_AMBULATORY_CARE_PROVIDER_SITE_OTHER): Payer: Medicaid Other | Admitting: Family Medicine

## 2017-04-13 ENCOUNTER — Ambulatory Visit (HOSPITAL_COMMUNITY)
Admission: RE | Admit: 2017-04-13 | Discharge: 2017-04-13 | Disposition: A | Discharge status: Home / Self Care | Payer: Medicaid Other | Source: Ambulatory Visit | Attending: Obstetrics and Gynecology | Admitting: Obstetrics and Gynecology

## 2017-04-13 ENCOUNTER — Other Ambulatory Visit (HOSPITAL_COMMUNITY): Payer: Self-pay | Admitting: *Deleted

## 2017-04-13 ENCOUNTER — Encounter (HOSPITAL_COMMUNITY): Payer: Self-pay

## 2017-04-13 VITALS — BP 105/69 | HR 90 | Wt 208.0 lb

## 2017-04-13 DIAGNOSIS — Z3A19 19 weeks gestation of pregnancy: Secondary | ICD-10-CM | POA: Diagnosis not present

## 2017-04-13 DIAGNOSIS — Z6835 Body mass index (BMI) 35.0-35.9, adult: Secondary | ICD-10-CM | POA: Insufficient documentation

## 2017-04-13 DIAGNOSIS — O0990 Supervision of high risk pregnancy, unspecified, unspecified trimester: Secondary | ICD-10-CM

## 2017-04-13 DIAGNOSIS — O0992 Supervision of high risk pregnancy, unspecified, second trimester: Secondary | ICD-10-CM | POA: Diagnosis present

## 2017-04-13 DIAGNOSIS — O09522 Supervision of elderly multigravida, second trimester: Secondary | ICD-10-CM

## 2017-04-13 DIAGNOSIS — O99210 Obesity complicating pregnancy, unspecified trimester: Secondary | ICD-10-CM

## 2017-04-13 DIAGNOSIS — N879 Dysplasia of cervix uteri, unspecified: Secondary | ICD-10-CM

## 2017-04-13 DIAGNOSIS — Z363 Encounter for antenatal screening for malformations: Secondary | ICD-10-CM | POA: Diagnosis not present

## 2017-04-13 DIAGNOSIS — O99212 Obesity complicating pregnancy, second trimester: Secondary | ICD-10-CM | POA: Diagnosis not present

## 2017-04-13 DIAGNOSIS — Z9889 Other specified postprocedural states: Secondary | ICD-10-CM

## 2017-04-13 DIAGNOSIS — O09299 Supervision of pregnancy with other poor reproductive or obstetric history, unspecified trimester: Secondary | ICD-10-CM | POA: Diagnosis not present

## 2017-04-13 NOTE — Progress Notes (Signed)
   PRENATAL VISIT NOTE  Subjective:  Heather Christian is a 36 y.o. [id number] at 6413w2d being seen today for ongoing prenatal care.  She is currently monitored for the following issues for this high-risk pregnancy and has Cervical dysplasia; Supervision of high risk pregnancy, antepartum; History of loop electrical excision procedure (LEEP); Advanced maternal age in multigravida, second trimester; Obesity, Class II, BMI 35-39.9; and Obesity in pregnancy on her problem list.  Patient reports chest congestion-- wants to take mucinex.  Contractions: Not present. Vag. Bleeding: None.  Movement: Present. Denies leaking of fluid.   The following portions of the patient's history were reviewed and updated as appropriate: allergies, current medications, past family history, past medical history, past social history, past surgical history and problem list. Problem list updated.  Objective:   Vitals:   04/13/17 1534  BP: 105/69  Pulse: 90  Weight: 208 lb (94.3 kg)    Fetal Status: Fetal Heart Rate (bpm): 154 Fundal Height: 19 cm Movement: Present     General:  Alert, oriented and cooperative. Patient is in no acute distress.  Skin: Skin is warm and dry. No rash noted.   Cardiovascular: Normal heart rate noted  Respiratory: Normal respiratory effort, no problems with respiration noted  Abdomen: Soft, gravid, appropriate for gestational age. Pain/Pressure: Absent     Pelvic:  Cervical exam deferred        Extremities: Normal range of motion.  Edema: None  Mental Status: Normal mood and affect. Normal behavior. Normal judgment and thought content.   Assessment and Plan:  Pregnancy: [id number] at 213w2d  1. Cervical dysplasia (hx of) - NIL with neg HPV in April 2018  2. Supervision of high risk pregnancy, antepartum UTD  Reviewed weight gain in pregnancy and activity Had anatomy scan- scheduled for repeat to complete scan  3. History of loop electrical excision procedure (LEEP)  4. Advanced  maternal age in multigravida, second trimester NIPT low risk  5. Obesity in pregnancy Reviewed goal of 11-15 lbs  Preterm labor symptoms and general obstetric precautions including but not limited to vaginal bleeding, contractions, leaking of fluid and fetal movement were reviewed in detail with the patient. Please refer to After Visit Summary for other counseling recommendations.  Return in about 4 weeks (around 05/11/2017) for Routine prenatal care.   Federico FlakeKimberly Niles Davinci Glotfelty, MD

## 2017-04-13 NOTE — Patient Instructions (Signed)

## 2017-04-13 NOTE — Addendum Note (Signed)
Encounter addended by: Vivien RotaSmall, Lance Galas H, RT on: 04/13/2017  2:02 PM<BR>    Actions taken: Imaging Exam ended

## 2017-04-17 ENCOUNTER — Other Ambulatory Visit: Payer: Self-pay | Admitting: Family Medicine

## 2017-04-17 DIAGNOSIS — K219 Gastro-esophageal reflux disease without esophagitis: Secondary | ICD-10-CM

## 2017-05-11 ENCOUNTER — Ambulatory Visit (HOSPITAL_COMMUNITY)
Admission: RE | Admit: 2017-05-11 | Discharge: 2017-05-11 | Disposition: A | Discharge status: Home / Self Care | Payer: Medicaid Other | Source: Ambulatory Visit | Attending: Obstetrics and Gynecology | Admitting: Obstetrics and Gynecology

## 2017-05-11 ENCOUNTER — Other Ambulatory Visit (HOSPITAL_COMMUNITY): Payer: Self-pay | Admitting: Obstetrics and Gynecology

## 2017-05-11 ENCOUNTER — Encounter (HOSPITAL_COMMUNITY): Payer: Self-pay

## 2017-05-11 ENCOUNTER — Ambulatory Visit (INDEPENDENT_AMBULATORY_CARE_PROVIDER_SITE_OTHER): Payer: Medicaid Other | Admitting: Family Medicine

## 2017-05-11 VITALS — BP 107/71 | HR 84 | Wt 210.0 lb

## 2017-05-11 DIAGNOSIS — O09292 Supervision of pregnancy with other poor reproductive or obstetric history, second trimester: Secondary | ICD-10-CM | POA: Diagnosis not present

## 2017-05-11 DIAGNOSIS — Z3A23 23 weeks gestation of pregnancy: Secondary | ICD-10-CM

## 2017-05-11 DIAGNOSIS — O09522 Supervision of elderly multigravida, second trimester: Secondary | ICD-10-CM | POA: Diagnosis present

## 2017-05-11 DIAGNOSIS — O99212 Obesity complicating pregnancy, second trimester: Secondary | ICD-10-CM | POA: Diagnosis not present

## 2017-05-11 DIAGNOSIS — O0990 Supervision of high risk pregnancy, unspecified, unspecified trimester: Secondary | ICD-10-CM

## 2017-05-11 DIAGNOSIS — Z362 Encounter for other antenatal screening follow-up: Secondary | ICD-10-CM

## 2017-05-11 DIAGNOSIS — O99210 Obesity complicating pregnancy, unspecified trimester: Secondary | ICD-10-CM

## 2017-05-11 DIAGNOSIS — O0992 Supervision of high risk pregnancy, unspecified, second trimester: Secondary | ICD-10-CM

## 2017-05-11 HISTORY — DX: Unspecified abnormal cytological findings in specimens from vagina: R87.629

## 2017-05-11 NOTE — Patient Instructions (Signed)

## 2017-05-11 NOTE — Progress Notes (Signed)
   PRENATAL VISIT NOTE  Subjective:  Heather AcostaDeborah E Saulter is a 36 y.o. [id number] at 7641w2d being seen today for ongoing prenatal care.  She is currently monitored for the following issues for this high-risk pregnancy and has Cervical dysplasia; Supervision of high risk pregnancy, antepartum; History of loop electrical excision procedure (LEEP); Advanced maternal age in multigravida, second trimester; Obesity, Class II, BMI 35-39.9; and Obesity in pregnancy on her problem list.  Patient reports no complaints.  Contractions: Not present. Vag. Bleeding: None.  Movement: Present. Denies leaking of fluid.   The following portions of the patient's history were reviewed and updated as appropriate: allergies, current medications, past family history, past medical history, past social history, past surgical history and problem list. Problem list updated.  Objective:   Vitals:   05/11/17 1041  BP: 107/71  Pulse: 84  Weight: 210 lb (95.3 kg)    Fetal Status: Fetal Heart Rate (bpm): 144   Movement: Present     General:  Alert, oriented and cooperative. Patient is in no acute distress.  Skin: Skin is warm and dry. No rash noted.   Cardiovascular: Normal heart rate noted  Respiratory: Normal respiratory effort, no problems with respiration noted  Abdomen: Soft, gravid, appropriate for gestational age. Pain/Pressure: Absent     Pelvic:  Cervical exam deferred        Extremities: Normal range of motion.  Edema: Trace  Mental Status: Normal mood and affect. Normal behavior. Normal judgment and thought content.   Assessment and Plan:  Pregnancy: [id number] at 4541w2d  1. Supervision of high risk pregnancy, antepartum F.u anatomy today Discussed allergy meds in pregnancy-- recommended nasal saline spray Reviewed vitamins in pregnancy  2. Advanced maternal age in multigravida, second trimester  3. Obesity in pregnancy 14 lb (6.35 kg) total weight gain. Discussed diet control and goal for 15-20 lb weight  gain based on BMI  Preterm labor symptoms and general obstetric precautions including but not limited to vaginal bleeding, contractions, leaking of fluid and fetal movement were reviewed in detail with the patient. Please refer to After Visit Summary for other counseling recommendations.  Return in about 4 weeks (around 06/08/2017) for Routine prenatal care.   Federico FlakeKimberly Niles Izzie Geers, MD

## 2017-06-02 ENCOUNTER — Other Ambulatory Visit: Payer: Self-pay | Admitting: Family Medicine

## 2017-06-02 DIAGNOSIS — K219 Gastro-esophageal reflux disease without esophagitis: Secondary | ICD-10-CM

## 2017-06-08 ENCOUNTER — Ambulatory Visit (INDEPENDENT_AMBULATORY_CARE_PROVIDER_SITE_OTHER): Payer: Medicaid Other | Admitting: Family Medicine

## 2017-06-08 ENCOUNTER — Encounter: Payer: Medicaid Other | Admitting: Family Medicine

## 2017-06-08 VITALS — BP 107/55 | HR 88 | Wt 215.0 lb

## 2017-06-08 DIAGNOSIS — O99891 Other specified diseases and conditions complicating pregnancy: Secondary | ICD-10-CM | POA: Insufficient documentation

## 2017-06-08 DIAGNOSIS — O26892 Other specified pregnancy related conditions, second trimester: Secondary | ICD-10-CM

## 2017-06-08 DIAGNOSIS — O0990 Supervision of high risk pregnancy, unspecified, unspecified trimester: Secondary | ICD-10-CM

## 2017-06-08 DIAGNOSIS — O09519 Supervision of elderly primigravida, unspecified trimester: Secondary | ICD-10-CM

## 2017-06-08 DIAGNOSIS — Z3482 Encounter for supervision of other normal pregnancy, second trimester: Secondary | ICD-10-CM | POA: Diagnosis not present

## 2017-06-08 DIAGNOSIS — O9989 Other specified diseases and conditions complicating pregnancy, childbirth and the puerperium: Secondary | ICD-10-CM

## 2017-06-08 DIAGNOSIS — M549 Dorsalgia, unspecified: Secondary | ICD-10-CM | POA: Insufficient documentation

## 2017-06-08 DIAGNOSIS — O0992 Supervision of high risk pregnancy, unspecified, second trimester: Secondary | ICD-10-CM

## 2017-06-08 DIAGNOSIS — O09512 Supervision of elderly primigravida, second trimester: Secondary | ICD-10-CM

## 2017-06-08 DIAGNOSIS — O99212 Obesity complicating pregnancy, second trimester: Secondary | ICD-10-CM

## 2017-06-08 DIAGNOSIS — O99210 Obesity complicating pregnancy, unspecified trimester: Secondary | ICD-10-CM

## 2017-06-08 NOTE — Progress Notes (Signed)
   PRENATAL VISIT NOTE  Subjective:  Heather Christian is a 36 y.o. [id number] at 3148w2d being seen today for ongoing prenatal care.  She is currently monitored for the following issues for this high-risk pregnancy and has Cervical dysplasia; Supervision of high risk pregnancy, antepartum; History of loop electrical excision procedure (LEEP); Advanced maternal age, primigravida, antepartum; Obesity, Class II, BMI 35-39.9; Obesity in pregnancy; and Back pain affecting pregnancy on her problem list.  Patient reports backache- numbness in right thigh with tingling. Usually at the end of day.  Contractions: Not present. Vag. Bleeding: None.  Movement: Present. Denies leaking of fluid.   The following portions of the patient's history were reviewed and updated as appropriate: allergies, current medications, past family history, past medical history, past social history, past surgical history and problem list. Problem list updated.  Objective:   Vitals:   06/08/17 0918  BP: (!) 107/55  Pulse: 88  Weight: 215 lb (97.5 kg)    Fetal Status: Fetal Heart Rate (bpm): 146   Movement: Present     General:  Alert, oriented and cooperative. Patient is in no acute distress.  Skin: Skin is warm and dry. No rash noted.   Cardiovascular: Normal heart rate noted  Respiratory: Normal respiratory effort, no problems with respiration noted  Abdomen: Soft, gravid, appropriate for gestational age.  Pain/Pressure: Absent     Pelvic: Cervical exam deferred        Extremities: Normal range of motion.  Edema: None  Mental Status:  Normal mood and affect. Normal behavior. Normal judgment and thought content.   Assessment and Plan:  Pregnancy: [id number] at 5548w2d  1. Supervision of high risk pregnancy, antepartum - 3rd trim labs today - CBC - HIV antibody - RPR - Tdap vaccine greater than or equal to 7yo IM - Glucose Tolerance, 2 Hours w/1 Hour -Discussed sciatica in pregnancy and back stretches  2. Obesity in  pregnancy TWG =19 lb (8.618 kg)  Taking ASA  Preterm labor symptoms and general obstetric precautions including but not limited to vaginal bleeding, contractions, leaking of fluid and fetal movement were reviewed in detail with the patient. Please refer to After Visit Summary for other counseling recommendations.  Return in about 4 weeks (around 07/06/2017) for Routine prenatal care.   Federico FlakeKimberly Niles Kandie Keiper, MD

## 2017-06-09 ENCOUNTER — Encounter: Payer: Self-pay | Admitting: *Deleted

## 2017-06-10 ENCOUNTER — Telehealth: Payer: Self-pay | Admitting: *Deleted

## 2017-06-10 LAB — CBC
Hematocrit: 33.9 % — ABNORMAL LOW (ref 34.0–46.6)
Hemoglobin: 11.5 g/dL (ref 11.1–15.9)
MCH: 29.3 pg (ref 26.6–33.0)
MCHC: 33.9 g/dL (ref 31.5–35.7)
MCV: 87 fL (ref 79–97)
PLATELETS: 288 10*3/uL (ref 150–379)
RBC: 3.92 x10E6/uL (ref 3.77–5.28)
RDW: 14.3 % (ref 12.3–15.4)
WBC: 7.7 10*3/uL (ref 3.4–10.8)

## 2017-06-10 LAB — GLUCOSE TOLERANCE, 2 HOURS W/ 1HR
GLUCOSE, 1 HOUR: 169 mg/dL (ref 65–179)
GLUCOSE, FASTING: 88 mg/dL (ref 65–91)
Glucose, 2 hour: 121 mg/dL (ref 65–152)

## 2017-06-10 LAB — HIV ANTIBODY (ROUTINE TESTING W REFLEX): HIV SCREEN 4TH GENERATION: NONREACTIVE

## 2017-06-10 LAB — RPR: RPR: NONREACTIVE

## 2017-06-10 NOTE — Telephone Encounter (Signed)
-----   Message from Heather L Bacon, NT sent at 06/10/2017  9:37 AM EDT ----- Regarding: GTT test results Contact: [phone] Call pt with from the results, can leave results cell phone voicemail.   

## 2017-06-10 NOTE — Telephone Encounter (Signed)
-----   Message from Lindell SparHeather L Bacon, [place] sent at 06/10/2017  9:37 AM EDT ----- Regarding: GTT test results Contact: [phone] Call pt with from the results, can leave results cell phone voicemail.

## 2017-06-10 NOTE — Telephone Encounter (Signed)
Pt aware of normal GTT result.

## 2017-07-05 ENCOUNTER — Other Ambulatory Visit: Payer: Self-pay | Admitting: Family Medicine

## 2017-07-05 DIAGNOSIS — K219 Gastro-esophageal reflux disease without esophagitis: Secondary | ICD-10-CM

## 2017-07-06 ENCOUNTER — Encounter: Payer: Medicaid Other | Admitting: Family Medicine

## 2017-07-07 ENCOUNTER — Ambulatory Visit (INDEPENDENT_AMBULATORY_CARE_PROVIDER_SITE_OTHER): Payer: Medicaid Other | Admitting: Obstetrics & Gynecology

## 2017-07-07 ENCOUNTER — Encounter: Payer: Self-pay | Admitting: *Deleted

## 2017-07-07 VITALS — BP 109/74 | HR 94 | Wt 215.0 lb

## 2017-07-07 DIAGNOSIS — E669 Obesity, unspecified: Secondary | ICD-10-CM

## 2017-07-07 DIAGNOSIS — O0990 Supervision of high risk pregnancy, unspecified, unspecified trimester: Secondary | ICD-10-CM

## 2017-07-07 DIAGNOSIS — Z23 Encounter for immunization: Secondary | ICD-10-CM

## 2017-07-07 DIAGNOSIS — O09519 Supervision of elderly primigravida, unspecified trimester: Secondary | ICD-10-CM

## 2017-07-07 MED ORDER — ABDOMINAL SUPPORT 2X/3X LARGE MISC: 1.0000 | Freq: Every evening | 0 refills | Status: DC | PRN

## 2017-07-07 NOTE — Progress Notes (Signed)
   PRENATAL VISIT NOTE  Subjective:  Heather Christian is a 36 y.o. [id number] at 4157w3d being seen today for ongoing prenatal care.  She is currently monitored for the following issues for this low-risk pregnancy and has Cervical dysplasia; Supervision of high risk pregnancy, antepartum; History of loop electrical excision procedure (LEEP); Advanced maternal age, primigravida, antepartum; Obesity, Class II, BMI 35-39.9; Obesity in pregnancy; and Back pain affecting pregnancy on her problem list.  Patient reports no complaints.  Contractions: Not present. Vag. Bleeding: None.  Movement: Present. Denies leaking of fluid.   The following portions of the patient's history were reviewed and updated as appropriate: allergies, current medications, past family history, past medical history, past social history, past surgical history and problem list. Problem list updated.  Objective:   Vitals:   07/07/17 0912  BP: 109/74  Pulse: 94  Weight: 215 lb (97.5 kg)    Fetal Status: Fetal Heart Rate (bpm): 150   Movement: Present     General:  Alert, oriented and cooperative. Patient is in no acute distress.  Skin: Skin is warm and dry. No rash noted.   Cardiovascular: Normal heart rate noted  Respiratory: Normal respiratory effort, no problems with respiration noted  Abdomen: Soft, gravid, appropriate for gestational age.  Pain/Pressure: Present     Pelvic: Cervical exam deferred        Extremities: Normal range of motion.  Edema: None  Mental Status:  Normal mood and affect. Normal behavior. Normal judgment and thought content.   Assessment and Plan:  Pregnancy: [id number] at 1557w3d  1. Supervision of high risk pregnancy, antepartum  - Elastic Bandages & Supports (ABDOMINAL SUPPORT 2X/3X LARGE) MISC; 1 Device by Does not apply route at bedtime as needed.  Dispense: 1 each; Refill: 0  2. Obesity, Class II, BMI 35-39.9   3. Advanced maternal age, primigravida, antepartum   Preterm labor symptoms  and general obstetric precautions including but not limited to vaginal bleeding, contractions, leaking of fluid and fetal movement were reviewed in detail with the patient. Please refer to After Visit Summary for other counseling recommendations.  No Follow-up on file.   Allie BossierMyra C Kendryck Lacroix, MD

## 2017-07-11 ENCOUNTER — Other Ambulatory Visit: Payer: Self-pay | Admitting: Family

## 2017-07-11 DIAGNOSIS — K219 Gastro-esophageal reflux disease without esophagitis: Secondary | ICD-10-CM

## 2017-07-11 NOTE — Telephone Encounter (Signed)
-----   Message from Marlis EdelsonWalidah N Karim, [place] sent at 07/11/2017 11:10 AM EDT ----- Bonita QuinYou can do either. ----- Message ----- From: Arne ClevelandHutchinson, Mandy J, CMA Sent: 07/11/2017  10:09 AM To: Marlis EdelsonWalidah N Karim, CNM  Can I increase Prilosec from 20mg  qd to 40mg  qd or 20mg  bid?

## 2017-07-15 ENCOUNTER — Ambulatory Visit (HOSPITAL_COMMUNITY)
Admission: RE | Admit: 2017-07-15 | Discharge: 2017-07-15 | Disposition: A | Discharge status: Home / Self Care | Payer: Medicaid Other | Source: Ambulatory Visit | Attending: Obstetrics & Gynecology | Admitting: Obstetrics & Gynecology

## 2017-07-15 ENCOUNTER — Encounter (HOSPITAL_COMMUNITY): Payer: Self-pay

## 2017-07-15 ENCOUNTER — Other Ambulatory Visit: Payer: Self-pay | Admitting: Obstetrics & Gynecology

## 2017-07-15 DIAGNOSIS — Z3A32 32 weeks gestation of pregnancy: Secondary | ICD-10-CM | POA: Diagnosis not present

## 2017-07-15 DIAGNOSIS — O09519 Supervision of elderly primigravida, unspecified trimester: Secondary | ICD-10-CM

## 2017-07-15 DIAGNOSIS — O09523 Supervision of elderly multigravida, third trimester: Secondary | ICD-10-CM

## 2017-07-15 DIAGNOSIS — M549 Dorsalgia, unspecified: Secondary | ICD-10-CM

## 2017-07-15 DIAGNOSIS — O0990 Supervision of high risk pregnancy, unspecified, unspecified trimester: Secondary | ICD-10-CM

## 2017-07-15 DIAGNOSIS — O9989 Other specified diseases and conditions complicating pregnancy, childbirth and the puerperium: Secondary | ICD-10-CM

## 2017-07-21 ENCOUNTER — Other Ambulatory Visit (HOSPITAL_COMMUNITY)
Admission: RE | Admit: 2017-07-21 | Discharge: 2017-07-21 | Disposition: A | Discharge status: Home / Self Care | Payer: Medicaid Other | Source: Ambulatory Visit | Attending: Obstetrics & Gynecology | Admitting: Obstetrics & Gynecology

## 2017-07-21 ENCOUNTER — Encounter: Payer: Self-pay | Admitting: *Deleted

## 2017-07-21 ENCOUNTER — Ambulatory Visit (INDEPENDENT_AMBULATORY_CARE_PROVIDER_SITE_OTHER): Payer: Medicaid Other | Admitting: Obstetrics & Gynecology

## 2017-07-21 VITALS — BP 129/70 | HR 80 | Wt 217.0 lb

## 2017-07-21 DIAGNOSIS — Z23 Encounter for immunization: Secondary | ICD-10-CM

## 2017-07-21 DIAGNOSIS — N898 Other specified noninflammatory disorders of vagina: Secondary | ICD-10-CM | POA: Insufficient documentation

## 2017-07-21 DIAGNOSIS — Z3A00 Weeks of gestation of pregnancy not specified: Secondary | ICD-10-CM | POA: Insufficient documentation

## 2017-07-21 DIAGNOSIS — O26893 Other specified pregnancy related conditions, third trimester: Secondary | ICD-10-CM | POA: Diagnosis present

## 2017-07-21 DIAGNOSIS — O09523 Supervision of elderly multigravida, third trimester: Secondary | ICD-10-CM

## 2017-07-21 DIAGNOSIS — O0990 Supervision of high risk pregnancy, unspecified, unspecified trimester: Secondary | ICD-10-CM

## 2017-07-21 NOTE — Patient Instructions (Signed)
Return to clinic for any scheduled appointments or obstetric concerns, or go to [facility] for evaluation  

## 2017-07-21 NOTE — Progress Notes (Signed)
   PRENATAL VISIT NOTE  Subjective:  Heather Christian is a 36 y.o. [phone] at [redacted]w[redacted]d being seen today for ongoing prenatal care.  She is currently monitored for the following issues for this high-risk pregnancy and has Cervical dysplasia; Supervision of high risk pregnancy, antepartum; History of loop electrical excision procedure (LEEP); Advanced maternal age, primigravida, antepartum; Obesity, Class II, BMI 35-39.9; Obesity in pregnancy; and Back pain affecting pregnancy on her problem list.  Patient reports increased, mildly odorous, nonpruritic clear vaginal discharge.  Contractions: Not present. Vag. Bleeding: None.  Movement: Present. Denies leaking of fluid.   The following portions of the patient's history were reviewed and updated as appropriate: allergies, current medications, past family history, past medical history, past social history, past surgical history and problem list. Problem list updated.  Objective:   Vitals:   07/21/17 0852  BP: 129/70  Pulse: 80  Weight: 217 lb (98.4 kg)    Fetal Status: Fetal Heart Rate (bpm): 135   Movement: Present     General:  Alert, oriented and cooperative. Patient is in no acute distress.  Skin: Skin is warm and dry. No rash noted.   Cardiovascular: Normal heart rate noted  Respiratory: Normal respiratory effort, no problems with respiration noted  Abdomen: Soft, gravid, appropriate for gestational age.  Pain/Pressure: Present     Pelvic: Cervical exam deferred      Clear vaginal discharge seen, no erythema.  Extremities: Normal range of motion.  Edema: None  Mental Status:  Normal mood and affect. Normal behavior. Normal judgment and thought content.   Assessment and Plan:  Pregnancy: [name] at [redacted]w[redacted]d  1. Flu vaccine need - Flu Vaccine QUAD 36+ mos IM (Fluarix, Quad PF) given today  2. Vaginal discharge during pregnancy in third trimester - Cervicovaginal ancillary only checked, will follow up results and manage  accordingly.  3. Advanced maternal age in multigravida, third trimester 4. Supervision of high risk pregnancy, antepartum Preterm labor symptoms and general obstetric precautions including but not limited to vaginal bleeding, contractions, leaking of fluid and fetal movement were reviewed in detail with the patient. Please refer to After Visit Summary for other counseling recommendations.  Return in about 2 weeks (around 08/04/2017).   Jaynie Collins, MD

## 2017-07-25 LAB — CERVICOVAGINAL ANCILLARY ONLY
Bacterial vaginitis: NEGATIVE
CHLAMYDIA, DNA PROBE: NEGATIVE
Candida vaginitis: NEGATIVE
Neisseria Gonorrhea: NEGATIVE
Trichomonas: NEGATIVE

## 2017-08-02 ENCOUNTER — Ambulatory Visit (INDEPENDENT_AMBULATORY_CARE_PROVIDER_SITE_OTHER): Payer: Medicaid Other | Admitting: Family Medicine

## 2017-08-02 VITALS — BP 98/55 | HR 88 | Wt 218.5 lb

## 2017-08-02 DIAGNOSIS — O0990 Supervision of high risk pregnancy, unspecified, unspecified trimester: Secondary | ICD-10-CM

## 2017-08-02 DIAGNOSIS — O0993 Supervision of high risk pregnancy, unspecified, third trimester: Secondary | ICD-10-CM

## 2017-08-02 NOTE — Progress Notes (Signed)
Some constipation

## 2017-08-02 NOTE — Patient Instructions (Signed)
 Third Trimester of Pregnancy The third trimester is from week 28 through week 40 (months 7 through 9). The third trimester is a time when the unborn baby (fetus) is growing rapidly. At the end of the ninth month, the fetus is about 20 inches in length and weighs 6-10 pounds. Body changes during your third trimester Your body will continue to go through many changes during pregnancy. The changes vary from woman to woman. During the third trimester:  Your weight will continue to increase. You can expect to gain 25-35 pounds (11-16 kg) by the end of the pregnancy.  You may begin to get stretch marks on your hips, abdomen, and breasts.  You may urinate more often because the fetus is moving lower into your pelvis and pressing on your bladder.  You may develop or continue to have heartburn. This is caused by increased hormones that slow down muscles in the digestive tract.  You may develop or continue to have constipation because increased hormones slow digestion and cause the muscles that push waste through your intestines to relax.  You may develop hemorrhoids. These are swollen veins (varicose veins) in the rectum that can itch or be painful.  You may develop swollen, bulging veins (varicose veins) in your legs.  You may have increased body aches in the pelvis, back, or thighs. This is due to weight gain and increased hormones that are relaxing your joints.  You may have changes in your hair. These can include thickening of your hair, rapid growth, and changes in texture. Some women also have hair loss during or after pregnancy, or hair that feels dry or thin. Your hair will most likely return to normal after your baby is born.  Your breasts will continue to grow and they will continue to become tender. A yellow fluid (colostrum) may leak from your breasts. This is the first milk you are producing for your baby.  Your belly button may stick out.  You may notice more swelling in your  hands, face, or ankles.  You may have increased tingling or numbness in your hands, arms, and legs. The skin on your belly may also feel numb.  You may feel short of breath because of your expanding uterus.  You may have more problems sleeping. This can be caused by the size of your belly, increased need to urinate, and an increase in your body's metabolism.  You may notice the fetus "dropping," or moving lower in your abdomen (lightening).  You may have increased vaginal discharge.  You may notice your joints feel loose and you may have pain around your pelvic bone.  What to expect at prenatal visits You will have prenatal exams every 2 weeks until week 36. Then you will have weekly prenatal exams. During a routine prenatal visit:  You will be weighed to make sure you and the baby are growing normally.  Your blood pressure will be taken.  Your abdomen will be measured to track your baby's growth.  The fetal heartbeat will be listened to.  Any test results from the previous visit will be discussed.  You may have a cervical check near your due date to see if your cervix has softened or thinned (effaced).  You will be tested for Group B streptococcus. This happens between 35 and 37 weeks.  Your health care provider may ask you:  What your birth plan is.  How you are feeling.  If you are feeling the baby move.  If you have   had any abnormal symptoms, such as leaking fluid, bleeding, severe headaches, or abdominal cramping.  If you are using any tobacco products, including cigarettes, chewing tobacco, and electronic cigarettes.  If you have any questions.  Other tests or screenings that may be performed during your third trimester include:  Blood tests that check for low iron levels (anemia).  Fetal testing to check the health, activity level, and growth of the fetus. Testing is done if you have certain medical conditions or if there are problems during the  pregnancy.  Nonstress test (NST). This test checks the health of your baby to make sure there are no signs of problems, such as the baby not getting enough oxygen. During this test, a belt is placed around your belly. The baby is made to move, and its heart rate is monitored during movement.  What is false labor? False labor is a condition in which you feel small, irregular tightenings of the muscles in the womb (contractions) that usually go away with rest, changing position, or drinking water. These are called Braxton Hicks contractions. Contractions may last for hours, days, or even weeks before true labor sets in. If contractions come at regular intervals, become more frequent, increase in intensity, or become painful, you should see your health care provider. What are the signs of labor?  Abdominal cramps.  Regular contractions that start at 10 minutes apart and become stronger and more frequent with time.  Contractions that start on the top of the uterus and spread down to the lower abdomen and back.  Increased pelvic pressure and dull back pain.  A watery or bloody mucus discharge that comes from the vagina.  Leaking of amniotic fluid. This is also known as your "water breaking." It could be a slow trickle or a gush. Let your health care provider know if it has a color or strange odor. If you have any of these signs, call your health care provider right away, even if it is before your due date. Follow these instructions at home: Medicines  Follow your health care provider's instructions regarding medicine use. Specific medicines may be either safe or unsafe to take during pregnancy.  Take a prenatal vitamin that contains at least 600 micrograms (mcg) of folic acid.  If you develop constipation, try taking a stool softener if your health care provider approves. Eating and drinking  Eat a balanced diet that includes fresh fruits and vegetables, whole grains, good sources of protein  such as meat, eggs, or tofu, and low-fat dairy. Your health care provider will help you determine the amount of weight gain that is right for you.  Avoid raw meat and uncooked cheese. These carry germs that can cause birth defects in the baby.  If you have low calcium intake from food, talk to your health care provider about whether you should take a daily calcium supplement.  Eat four or five small meals rather than three large meals a day.  Limit foods that are high in fat and processed sugars, such as fried and sweet foods.  To prevent constipation: ? Drink enough fluid to keep your urine clear or pale yellow. ? Eat foods that are high in fiber, such as fresh fruits and vegetables, whole grains, and beans. Activity  Exercise only as directed by your health care provider. Most women can continue their usual exercise routine during pregnancy. Try to exercise for 30 minutes at least 5 days a week. Stop exercising if you experience uterine contractions.  Avoid   heavy lifting.  Do not exercise in extreme heat or humidity, or at high altitudes.  Wear low-heel, comfortable shoes.  Practice good posture.  You may continue to have sex unless your health care provider tells you otherwise. Relieving pain and discomfort  Take frequent breaks and rest with your legs elevated if you have leg cramps or low back pain.  Take warm sitz baths to soothe any pain or discomfort caused by hemorrhoids. Use hemorrhoid cream if your health care provider approves.  Wear a good support bra to prevent discomfort from breast tenderness.  If you develop varicose veins: ? Wear support pantyhose or compression stockings as told by your healthcare provider. ? Elevate your feet for 15 minutes, 3-4 times a day. Prenatal care  Write down your questions. Take them to your prenatal visits.  Keep all your prenatal visits as told by your health care provider. This is important. Safety  Wear your seat belt at  all times when driving.  Make a list of emergency phone numbers, including numbers for family, friends, the hospital, and police and fire departments. General instructions  Avoid cat litter boxes and soil used by cats. These carry germs that can cause birth defects in the baby. If you have a cat, ask someone to clean the litter box for you.  Do not travel far distances unless it is absolutely necessary and only with the approval of your health care provider.  Do not use hot tubs, steam rooms, or saunas.  Do not drink alcohol.  Do not use any products that contain nicotine or tobacco, such as cigarettes and e-cigarettes. If you need help quitting, ask your health care provider.  Do not use any medicinal herbs or unprescribed drugs. These chemicals affect the formation and growth of the baby.  Do not douche or use tampons or scented sanitary pads.  Do not cross your legs for long periods of time.  To prepare for the arrival of your baby: ? Take prenatal classes to understand, practice, and ask questions about labor and delivery. ? Make a trial run to the hospital. ? Visit the hospital and tour the maternity area. ? Arrange for maternity or paternity leave through employers. ? Arrange for family and friends to take care of pets while you are in the hospital. ? Purchase a rear-facing car seat and make sure you know how to install it in your car. ? Pack your hospital bag. ? Prepare the baby's nursery. Make sure to remove all pillows and stuffed animals from the baby's crib to prevent suffocation.  Visit your dentist if you have not gone during your pregnancy. Use a soft toothbrush to brush your teeth and be gentle when you floss. Contact a health care provider if:  You are unsure if you are in labor or if your water has broken.  You become dizzy.  You have mild pelvic cramps, pelvic pressure, or nagging pain in your abdominal area.  You have lower back pain.  You have persistent  nausea, vomiting, or diarrhea.  You have an unusual or bad smelling vaginal discharge.  You have pain when you urinate. Get help right away if:  Your water breaks before 37 weeks.  You have regular contractions less than 5 minutes apart before 37 weeks.  You have a fever.  You are leaking fluid from your vagina.  You have spotting or bleeding from your vagina.  You have severe abdominal pain or cramping.  You have rapid weight loss or weight   gain.  You have shortness of breath with chest pain.  You notice sudden or extreme swelling of your face, hands, ankles, feet, or legs.  Your baby makes fewer than 10 movements in 2 hours.  You have severe headaches that do not go away when you take medicine.  You have vision changes. Summary  The third trimester is from week 28 through week 40, months 7 through 9. The third trimester is a time when the unborn baby (fetus) is growing rapidly.  During the third trimester, your discomfort may increase as you and your baby continue to gain weight. You may have abdominal, leg, and back pain, sleeping problems, and an increased need to urinate.  During the third trimester your breasts will keep growing and they will continue to become tender. A yellow fluid (colostrum) may leak from your breasts. This is the first milk you are producing for your baby.  False labor is a condition in which you feel small, irregular tightenings of the muscles in the womb (contractions) that eventually go away. These are called Braxton Hicks contractions. Contractions may last for hours, days, or even weeks before true labor sets in.  Signs of labor can include: abdominal cramps; regular contractions that start at 10 minutes apart and become stronger and more frequent with time; watery or bloody mucus discharge that comes from the vagina; increased pelvic pressure and dull back pain; and leaking of amniotic fluid. This information is not intended to replace advice  given to you by your health care provider. Make sure you discuss any questions you have with your health care provider. Document Released: 10/12/2001 Document Revised: 03/25/2016 Document Reviewed: 12/19/2012 [employer] Interactive Patient Education  2017 [employer]   Breastfeeding Deciding to breastfeed is one of the best choices you can make for you and your baby. A change in hormones during pregnancy causes your breast tissue to grow and increases the number and size of your milk ducts. These hormones also allow proteins, sugars, and fats from your blood supply to make breast milk in your milk-producing glands. Hormones prevent breast milk from being released before your baby is born as well as prompt milk flow after birth. Once breastfeeding has begun, thoughts of your baby, as well as his or her sucking or crying, can stimulate the release of milk from your milk-producing glands. Benefits of breastfeeding For Your Baby  Your first milk (colostrum) helps your baby's digestive system function better.  There are antibodies in your milk that help your baby fight off infections.  Your baby has a lower incidence of asthma, allergies, and sudden infant death syndrome.  The nutrients in breast milk are better for your baby than infant formulas and are designed uniquely for your baby's needs.  Breast milk improves your baby's brain development.  Your baby is less likely to develop other conditions, such as childhood obesity, asthma, or type 2 diabetes mellitus.  For You  Breastfeeding helps to create a very special bond between you and your baby.  Breastfeeding is convenient. Breast milk is always available at the correct temperature and costs nothing.  Breastfeeding helps to burn calories and helps you lose the weight gained during pregnancy.  Breastfeeding makes your uterus contract to its prepregnancy size faster and slows bleeding (lochia) after you give birth.  Breastfeeding helps  to lower your risk of developing type 2 diabetes mellitus, osteoporosis, and breast or ovarian cancer later in life.  Signs that your baby is hungry Early Signs of Hunger    Increased alertness or activity.  Stretching.  Movement of the head from side to side.  Movement of the head and opening of the mouth when the corner of the mouth or cheek is stroked (rooting).  Increased sucking sounds, smacking lips, cooing, sighing, or squeaking.  Hand-to-mouth movements.  Increased sucking of fingers or hands.  Late Signs of Hunger  Fussing.  Intermittent crying.  Extreme Signs of Hunger Signs of extreme hunger will require calming and consoling before your baby will be able to breastfeed successfully. Do not wait for the following signs of extreme hunger to occur before you initiate breastfeeding:  Restlessness.  A loud, strong cry.  Screaming.  Breastfeeding basics Breastfeeding Initiation  Find a comfortable place to sit or lie down, with your neck and back well supported.  Place a pillow or rolled up blanket under your baby to bring him or her to the level of your breast (if you are seated). Nursing pillows are specially designed to help support your arms and your baby while you breastfeed.  Make sure that your baby's abdomen is facing your abdomen.  Gently massage your breast. With your fingertips, massage from your chest wall toward your nipple in a circular motion. This encourages milk flow. You may need to continue this action during the feeding if your milk flows slowly.  Support your breast with 4 fingers underneath and your thumb above your nipple. Make sure your fingers are well away from your nipple and your baby's mouth.  Stroke your baby's lips gently with your finger or nipple.  When your baby's mouth is open wide enough, quickly bring your baby to your breast, placing your entire nipple and as much of the colored area around your nipple (areola) as possible into  your baby's mouth. ? More areola should be visible above your baby's upper lip than below the lower lip. ? Your baby's tongue should be between his or her lower gum and your breast.  Ensure that your baby's mouth is correctly positioned around your nipple (latched). Your baby's lips should create a seal on your breast and be turned out (everted).  It is common for your baby to suck about 2-3 minutes in order to start the flow of breast milk.  Latching Teaching your baby how to latch on to your breast properly is very important. An improper latch can cause nipple pain and decreased milk supply for you and poor weight gain in your baby. Also, if your baby is not latched onto your nipple properly, he or she may swallow some air during feeding. This can make your baby fussy. Burping your baby when you switch breasts during the feeding can help to get rid of the air. However, teaching your baby to latch on properly is still the best way to prevent fussiness from swallowing air while breastfeeding. Signs that your baby has successfully latched on to your nipple:  Silent tugging or silent sucking, without causing you pain.  Swallowing heard between every 3-4 sucks.  Muscle movement above and in front of his or her ears while sucking.  Signs that your baby has not successfully latched on to nipple:  Sucking sounds or smacking sounds from your baby while breastfeeding.  Nipple pain.  If you think your baby has not latched on correctly, slip your finger into the corner of your baby's mouth to break the suction and place it between your baby's gums. Attempt breastfeeding initiation again. Signs of Successful Breastfeeding Signs from your baby:  A   gradual decrease in the number of sucks or complete cessation of sucking.  Falling asleep.  Relaxation of his or her body.  Retention of a small amount of milk in his or her mouth.  Letting go of your breast by himself or herself.  Signs from  you:  Breasts that have increased in firmness, weight, and size 1-3 hours after feeding.  Breasts that are softer immediately after breastfeeding.  Increased milk volume, as well as a change in milk consistency and color by the fifth day of breastfeeding.  Nipples that are not sore, cracked, or bleeding.  Signs That Your Baby is Getting Enough Milk  Wetting at least 1-2 diapers during the first 24 hours after birth.  Wetting at least 5-6 diapers every 24 hours for the first week after birth. The urine should be clear or pale yellow by 5 days after birth.  Wetting 6-8 diapers every 24 hours as your baby continues to grow and develop.  At least 3 stools in a 24-hour period by age 5 days. The stool should be soft and yellow.  At least 3 stools in a 24-hour period by age 7 days. The stool should be seedy and yellow.  No loss of weight greater than 10% of birth weight during the first 3 days of age.  Average weight gain of 4-7 ounces (113-198 g) per week after age 4 days.  Consistent daily weight gain by age 5 days, without weight loss after the age of 2 weeks.  After a feeding, your baby may spit up a small amount. This is common. Breastfeeding frequency and duration Frequent feeding will help you make more milk and can prevent sore nipples and breast engorgement. Breastfeed when you feel the need to reduce the fullness of your breasts or when your baby shows signs of hunger. This is called "breastfeeding on demand." Avoid introducing a pacifier to your baby while you are working to establish breastfeeding (the first 4-6 weeks after your baby is born). After this time you may choose to use a pacifier. Research has shown that pacifier use during the first year of a baby's life decreases the risk of sudden infant death syndrome (SIDS). Allow your baby to feed on each breast as long as he or she wants. Breastfeed until your baby is finished feeding. When your baby unlatches or falls asleep  while feeding from the first breast, offer the second breast. Because newborns are often sleepy in the first few weeks of life, you may need to awaken your baby to get him or her to feed. Breastfeeding times will vary from baby to baby. However, the following rules can serve as a guide to help you ensure that your baby is properly fed:  Newborns (babies 4 weeks of age or younger) may breastfeed every 1-3 hours.  Newborns should not go longer than 3 hours during the day or 5 hours during the night without breastfeeding.  You should breastfeed your baby a minimum of 8 times in a 24-hour period until you begin to introduce solid foods to your baby at around 6 months of age.  Breast milk pumping Pumping and storing breast milk allows you to ensure that your baby is exclusively fed your breast milk, even at times when you are unable to breastfeed. This is especially important if you are going back to work while you are still breastfeeding or when you are not able to be present during feedings. Your lactation consultant can give you guidelines on how   long it is safe to store breast milk. A breast pump is a machine that allows you to pump milk from your breast into a sterile bottle. The pumped breast milk can then be stored in a refrigerator or freezer. Some breast pumps are operated by hand, while others use electricity. Ask your lactation consultant which type will work best for you. Breast pumps can be purchased, but some hospitals and breastfeeding support groups lease breast pumps on a monthly basis. A lactation consultant can teach you how to hand express breast milk, if you prefer not to use a pump. Caring for your breasts while you breastfeed Nipples can become dry, cracked, and sore while breastfeeding. The following recommendations can help keep your breasts moisturized and healthy:  Avoid using soap on your nipples.  Wear a supportive bra. Although not required, special nursing bras and tank  tops are designed to allow access to your breasts for breastfeeding without taking off your entire bra or top. Avoid wearing underwire-style bras or extremely tight bras.  Air dry your nipples for 3-4minutes after each feeding.  Use only cotton bra pads to absorb leaked breast milk. Leaking of breast milk between feedings is normal.  Use lanolin on your nipples after breastfeeding. Lanolin helps to maintain your skin's normal moisture barrier. If you use pure lanolin, you do not need to wash it off before feeding your baby again. Pure lanolin is not toxic to your baby. You may also hand express a few drops of breast milk and gently massage that milk into your nipples and allow the milk to air dry.  In the first few weeks after giving birth, some women experience extremely full breasts (engorgement). Engorgement can make your breasts feel heavy, warm, and tender to the touch. Engorgement peaks within 3-5 days after you give birth. The following recommendations can help ease engorgement:  Completely empty your breasts while breastfeeding or pumping. You may want to start by applying warm, moist heat (in the shower or with warm water-soaked hand towels) just before feeding or pumping. This increases circulation and helps the milk flow. If your baby does not completely empty your breasts while breastfeeding, pump any extra milk after he or she is finished.  Wear a snug bra (nursing or regular) or tank top for 1-2 days to signal your body to slightly decrease milk production.  Apply ice packs to your breasts, unless this is too uncomfortable for you.  Make sure that your baby is latched on and positioned properly while breastfeeding.  If engorgement persists after 48 hours of following these recommendations, contact your health care provider or a lactation consultant. Overall health care recommendations while breastfeeding  Eat healthy foods. Alternate between meals and snacks, eating 3 of each per  day. Because what you eat affects your breast milk, some of the foods may make your baby more irritable than usual. Avoid eating these foods if you are sure that they are negatively affecting your baby.  Drink milk, fruit juice, and water to satisfy your thirst (about 10 glasses a day).  Rest often, relax, and continue to take your prenatal vitamins to prevent fatigue, stress, and anemia.  Continue breast self-awareness checks.  Avoid chewing and smoking tobacco. Chemicals from cigarettes that pass into breast milk and exposure to secondhand smoke may harm your baby.  Avoid alcohol and drug use, including marijuana. Some medicines that may be harmful to your baby can pass through breast milk. It is important to ask your health care   provider before taking any medicine, including all over-the-counter and prescription medicine as well as vitamin and herbal supplements. It is possible to become pregnant while breastfeeding. If birth control is desired, ask your health care provider about options that will be safe for your baby. Contact a health care provider if:  You feel like you want to stop breastfeeding or have become frustrated with breastfeeding.  You have painful breasts or nipples.  Your nipples are cracked or bleeding.  Your breasts are red, tender, or warm.  You have a swollen area on either breast.  You have a fever or chills.  You have nausea or vomiting.  You have drainage other than breast milk from your nipples.  Your breasts do not become full before feedings by the fifth day after you give birth.  You feel sad and depressed.  Your baby is too sleepy to eat well.  Your baby is having trouble sleeping.  Your baby is wetting less than 3 diapers in a 24-hour period.  Your baby has less than 3 stools in a 24-hour period.  Your baby's skin or the white part of his or her eyes becomes yellow.  Your baby is not gaining weight by 5 days of age. Get help right away  if:  Your baby is overly tired (lethargic) and does not want to wake up and feed.  Your baby develops an unexplained fever. This information is not intended to replace advice given to you by your health care provider. Make sure you discuss any questions you have with your health care provider. Document Released: 10/18/2005 Document Revised: 03/31/2016 Document Reviewed: 04/11/2013 Elsevier Interactive Patient Education  2017 Elsevier Inc.  

## 2017-08-03 NOTE — Progress Notes (Signed)
   PRENATAL VISIT NOTE  Subjective:  Heather Christian is a 36 y.o. [id number] at [redacted]w[redacted]d being seen today for ongoing prenatal care.  She is currently monitored for the following issues for this high-risk pregnancy and has Cervical dysplasia; Supervision of high risk pregnancy, antepartum; History of loop electrical excision procedure (LEEP); Advanced maternal age, primigravida, antepartum; Obesity, Class II, BMI 35-39.9; Obesity in pregnancy; and Back pain affecting pregnancy on her problem list.  Patient reports no complaints.  Contractions: Not present.  .  Movement: Present. Denies leaking of fluid.   The following portions of the patient's history were reviewed and updated as appropriate: allergies, current medications, past family history, past medical history, past social history, past surgical history and problem list. Problem list updated.  Objective:   Vitals:   08/02/17 0938  BP: (!) 98/55  Pulse: 88  Weight: 218 lb 8 oz (99.1 kg)    Fetal Status: Fetal Heart Rate (bpm): 145 Fundal Height: 35 cm Movement: Present     General:  Alert, oriented and cooperative. Patient is in no acute distress.  Skin: Skin is warm and dry. No rash noted.   Cardiovascular: Normal heart rate noted  Respiratory: Normal respiratory effort, no problems with respiration noted  Abdomen: Soft, gravid, appropriate for gestational age.  Pain/Pressure: Present     Pelvic: Cervical exam deferred        Extremities: Normal range of motion.  Edema: None  Mental Status:  Normal mood and affect. Normal behavior. Normal judgment and thought content.  U/S for growth 07/15/17, vtx, nml fluid, 4 lb 2 oz (46%) Assessment and Plan:  Pregnancy: [id number] at [redacted]w[redacted]d  1. Supervision of high risk pregnancy, antepartum Cultures next week  Preterm labor symptoms and general obstetric precautions including but not limited to vaginal bleeding, contractions, leaking of fluid and fetal movement were reviewed in detail with the  patient. Please refer to After Visit Summary for other counseling recommendations.  Return in 1 week (on 08/09/2017).   Reva Bores, MD

## 2017-08-08 ENCOUNTER — Other Ambulatory Visit (HOSPITAL_COMMUNITY)
Admission: RE | Admit: 2017-08-08 | Discharge: 2017-08-08 | Disposition: A | Discharge status: Home / Self Care | Payer: Medicaid Other | Source: Ambulatory Visit | Attending: Obstetrics & Gynecology | Admitting: Obstetrics & Gynecology

## 2017-08-08 ENCOUNTER — Ambulatory Visit (INDEPENDENT_AMBULATORY_CARE_PROVIDER_SITE_OTHER): Payer: Medicaid Other | Admitting: Obstetrics & Gynecology

## 2017-08-08 VITALS — BP 108/69 | HR 91 | Wt 218.1 lb

## 2017-08-08 DIAGNOSIS — O09513 Supervision of elderly primigravida, third trimester: Secondary | ICD-10-CM

## 2017-08-08 DIAGNOSIS — O0990 Supervision of high risk pregnancy, unspecified, unspecified trimester: Secondary | ICD-10-CM

## 2017-08-08 DIAGNOSIS — Z3A36 36 weeks gestation of pregnancy: Secondary | ICD-10-CM | POA: Insufficient documentation

## 2017-08-08 DIAGNOSIS — O09519 Supervision of elderly primigravida, unspecified trimester: Secondary | ICD-10-CM

## 2017-08-08 DIAGNOSIS — O0993 Supervision of high risk pregnancy, unspecified, third trimester: Secondary | ICD-10-CM

## 2017-08-08 NOTE — Progress Notes (Signed)
   PRENATAL VISIT NOTE  Subjective:  Heather Christian is a 36 y.o. [id number] at [redacted]w[redacted]d being seen today for ongoing prenatal care.  She is currently monitored for the following issues for this high-risk pregnancy and has Cervical dysplasia; Supervision of high risk pregnancy, antepartum; History of loop electrical excision procedure (LEEP); Advanced maternal age, primigravida, antepartum; Obesity, Class II, BMI 35-39.9; Obesity in pregnancy; and Back pain affecting pregnancy on her problem list.  Patient reports occasional contractions.  Contractions: Not present. Vag. Bleeding: None.  Movement: Present. Denies leaking of fluid.   The following portions of the patient's history were reviewed and updated as appropriate: allergies, current medications, past family history, past medical history, past social history, past surgical history and problem list. Problem list updated.  Objective:   Vitals:   08/08/17 1601  BP: 108/69  Pulse: 91  Weight: 218 lb 1.6 oz (98.9 kg)    Fetal Status: Fetal Heart Rate (bpm): 132 Fundal Height: 37 cm Movement: Present  Presentation: Vertex  General:  Alert, oriented and cooperative. Patient is in no acute distress.  Skin: Skin is warm and dry. No rash noted.   Cardiovascular: Normal heart rate noted  Respiratory: Normal respiratory effort, no problems with respiration noted  Abdomen: Soft, gravid, appropriate for gestational age.  Pain/Pressure: Present     Pelvic: Cervical exam performed Dilation: 3 Effacement (%): 30 Station: Ballotable  Extremities: Normal range of motion.  Edema: None  Mental Status:  Normal mood and affect. Normal behavior. Normal judgment and thought content.   Assessment and Plan:  Pregnancy: [name] at [redacted]w[redacted]d  1. Advanced maternal age, primigravida, antepartum 2. Supervision of high risk pregnancy, antepartum Pelvic cultures done today. - Culture, beta strep (group b only) - GC/Chlamydia probe amp ([place])not at  [facility] Preterm labor symptoms and general obstetric precautions including but not limited to vaginal bleeding, contractions, leaking of fluid and fetal movement were reviewed in detail with the patient. Please refer to After Visit Summary for other counseling recommendations.  Return in about 1 week (around 08/15/2017) for OB Visit.   Jaynie Collins, MD

## 2017-08-08 NOTE — Patient Instructions (Signed)
Return to clinic for any scheduled appointments or obstetric concerns, or go to [facility] for evaluation  

## 2017-08-10 LAB — GC/CHLAMYDIA PROBE AMP (~~LOC~~) NOT AT ARMC
CHLAMYDIA, DNA PROBE: NEGATIVE
NEISSERIA GONORRHEA: NEGATIVE

## 2017-08-12 ENCOUNTER — Encounter (HOSPITAL_COMMUNITY): Payer: Self-pay

## 2017-08-12 ENCOUNTER — Inpatient Hospital Stay (HOSPITAL_COMMUNITY)
Admission: AD | Admit: 2017-08-12 | Discharge: 2017-08-12 | Disposition: A | Discharge status: Home / Self Care | Payer: Medicaid Other | Source: Ambulatory Visit | Attending: Obstetrics and Gynecology | Admitting: Obstetrics and Gynecology

## 2017-08-12 DIAGNOSIS — O479 False labor, unspecified: Secondary | ICD-10-CM

## 2017-08-12 DIAGNOSIS — O0993 Supervision of high risk pregnancy, unspecified, third trimester: Secondary | ICD-10-CM | POA: Insufficient documentation

## 2017-08-12 DIAGNOSIS — Z3A37 37 weeks gestation of pregnancy: Secondary | ICD-10-CM | POA: Insufficient documentation

## 2017-08-12 LAB — CULTURE, BETA STREP (GROUP B ONLY): STREP GP B CULTURE: NEGATIVE

## 2017-08-12 NOTE — Discharge Instructions (Signed)

## 2017-08-12 NOTE — MAU Note (Signed)
Patient presents with loosing mucus plug yesterday, unsure if having contractions. Constipated had BM this morning quantity sufficient.

## 2017-08-15 ENCOUNTER — Ambulatory Visit (INDEPENDENT_AMBULATORY_CARE_PROVIDER_SITE_OTHER): Payer: Medicaid Other | Admitting: Family Medicine

## 2017-08-15 VITALS — BP 118/69 | HR 91 | Wt 218.0 lb

## 2017-08-15 DIAGNOSIS — O99210 Obesity complicating pregnancy, unspecified trimester: Secondary | ICD-10-CM

## 2017-08-15 DIAGNOSIS — K219 Gastro-esophageal reflux disease without esophagitis: Secondary | ICD-10-CM

## 2017-08-15 DIAGNOSIS — O09519 Supervision of elderly primigravida, unspecified trimester: Secondary | ICD-10-CM

## 2017-08-15 DIAGNOSIS — O0990 Supervision of high risk pregnancy, unspecified, unspecified trimester: Secondary | ICD-10-CM

## 2017-08-15 MED ORDER — OMEPRAZOLE 20 MG PO CPDR: DELAYED_RELEASE_CAPSULE | ORAL | 0 refills | Status: DC

## 2017-08-15 NOTE — Progress Notes (Signed)
   PRENATAL VISIT NOTE  Subjective:  Heather Christian is a 36 y.o. [id number] at [redacted]w[redacted]d being seen today for ongoing prenatal care.  She is currently monitored for the following issues for this high-risk pregnancy and has Cervical dysplasia; Supervision of high risk pregnancy, antepartum; History of loop electrical excision procedure (LEEP); Advanced maternal age, primigravida, antepartum; Obesity, Class II, BMI 35-39.9; Obesity in pregnancy; and Back pain affecting pregnancy on her problem list.  Patient reports occasional contractions.  Contractions: Not present. Vag. Bleeding: None.  Movement: Present. Denies leaking of fluid.   The following portions of the patient's history were reviewed and updated as appropriate: allergies, current medications, past family history, past medical history, past social history, past surgical history and problem list. Problem list updated.  Objective:   Vitals:   08/15/17 1621  BP: 118/69  Pulse: 91  Weight: 218 lb (98.9 kg)    Fetal Status: Fetal Heart Rate (bpm): 125   Movement: Present     General:  Alert, oriented and cooperative. Patient is in no acute distress.  Skin: Skin is warm and dry. No rash noted.   Cardiovascular: Normal heart rate noted  Respiratory: Normal respiratory effort, no problems with respiration noted  Abdomen: Soft, gravid, appropriate for gestational age.  Pain/Pressure: Present     Pelvic: Cervical exam deferred        Extremities: Normal range of motion.  Edema: None  Mental Status:  Normal mood and affect. Normal behavior. Normal judgment and thought content.   Assessment and Plan:  Pregnancy: [name] at [redacted]w[redacted]d  1. Supervision of high risk pregnancy, antepartum FHT and FH normal  2. Advanced maternal age, primigravida, antepartum  3. Obesity in pregnancy   Term labor symptoms and general obstetric precautions including but not limited to vaginal bleeding, contractions, leaking of fluid and fetal movement were  reviewed in detail with the patient. Please refer to After Visit Summary for other counseling recommendations.  No Follow-up on file.   Levie Heritage, DO

## 2017-08-19 ENCOUNTER — Telehealth: Payer: Self-pay

## 2017-08-19 NOTE — Telephone Encounter (Signed)
Patient wanted to know if it ok to use preparation H. I advised patient is ok to use preparation H

## 2017-08-19 NOTE — Telephone Encounter (Signed)
-----   Message from Lindell SparHeather L Bacon, [place] sent at 08/19/2017  8:17 AM EDT ----- Regarding: medication question Please call pt about a medication question

## 2017-08-22 ENCOUNTER — Ambulatory Visit (INDEPENDENT_AMBULATORY_CARE_PROVIDER_SITE_OTHER): Payer: Medicaid Other | Admitting: Obstetrics & Gynecology

## 2017-08-22 VITALS — BP 99/65 | HR 77 | Wt 218.5 lb

## 2017-08-22 DIAGNOSIS — O09519 Supervision of elderly primigravida, unspecified trimester: Secondary | ICD-10-CM

## 2017-08-22 DIAGNOSIS — O0993 Supervision of high risk pregnancy, unspecified, third trimester: Secondary | ICD-10-CM

## 2017-08-22 DIAGNOSIS — O0990 Supervision of high risk pregnancy, unspecified, unspecified trimester: Secondary | ICD-10-CM

## 2017-08-22 DIAGNOSIS — O2243 Hemorrhoids in pregnancy, third trimester: Secondary | ICD-10-CM

## 2017-08-22 DIAGNOSIS — O09513 Supervision of elderly primigravida, third trimester: Secondary | ICD-10-CM

## 2017-08-22 MED ORDER — HYDROCORTISONE ACETATE 25 MG RE SUPP: 25.0000 mg | Freq: Two times a day (BID) | RECTAL | 3 refills | Status: AC | PRN

## 2017-08-22 NOTE — Progress Notes (Signed)
   PRENATAL VISIT NOTE  Subjective:  Heather AcostaDeborah E Christian is a 36 y.o. [id number] at 1066w0d being seen today for ongoing prenatal care.  She is currently monitored for the following issues for this low-risk pregnancy and has Cervical dysplasia; Supervision of high risk pregnancy, antepartum; History of loop electrical excision procedure (LEEP); Advanced maternal age, primigravida, antepartum; Obesity, Class II, BMI 35-39.9; Obesity in pregnancy; Back pain affecting pregnancy; and Hemorrhoids during pregnancy in third trimester on her problem list.  Patient reports symptomatic hemorrhoids, Preparation H not helping.  Contractions: Not present. Vag. Bleeding: None.  Movement: Present. Denies leaking of fluid.   The following portions of the patient's history were reviewed and updated as appropriate: allergies, current medications, past family history, past medical history, past social history, past surgical history and problem list. Problem list updated.  Objective:   Vitals:   08/22/17 1316  BP: 99/65  Pulse: 77  Weight: 218 lb 8 oz (99.1 kg)    Fetal Status: Fetal Heart Rate (bpm): 129 Fundal Height: 39 cm Movement: Present  Presentation: Vertex  General:  Alert, oriented and cooperative. Patient is in no acute distress.  Skin: Skin is warm and dry. No rash noted.   Cardiovascular: Normal heart rate noted  Respiratory: Normal respiratory effort, no problems with respiration noted  Abdomen: Soft, gravid, appropriate for gestational age.  Pain/Pressure: Present     Pelvic: Cervical exam performed Dilation: 3 Effacement (%): 30 Station: Ballotable  External hemorrhoids seen, no thrombosis.  Extremities: Normal range of motion.  Edema: None  Mental Status:  Normal mood and affect. Normal behavior. Normal judgment and thought content.   Assessment and Plan:  Pregnancy: A5W0981G7P2042 at 7166w0d  1. Hemorrhoids during pregnancy in third trimester Anusol prescribed.  Will monitor response. -  hydrocortisone (ANUSOL-HC) 25 MG suppository; Place 1 suppository (25 mg total) rectally 2 (two) times daily as needed for hemorrhoids or anal itching.  Dispense: 24 suppository; Refill: 3  2. Advanced maternal age, primigravida, antepartum 3. Supervision of high risk pregnancy, antepartum Term labor symptoms and general obstetric precautions including but not limited to vaginal bleeding, contractions, leaking of fluid and fetal movement were reviewed in detail with the patient. Please refer to After Visit Summary for other counseling recommendations.  Return in about 1 week (around 08/29/2017) for OB Visit.   Jaynie CollinsUgonna Anyanwu, MD

## 2017-08-22 NOTE — Patient Instructions (Signed)
Return to clinic for any scheduled appointments or obstetric concerns, or go to [facility] for evaluation  

## 2017-08-28 ENCOUNTER — Inpatient Hospital Stay (HOSPITAL_COMMUNITY)
Admission: AD | Admit: 2017-08-28 | Discharge: 2017-08-28 | Disposition: A | Discharge status: Home / Self Care | Payer: Medicaid Other | Source: Ambulatory Visit | Attending: Obstetrics and Gynecology | Admitting: Obstetrics and Gynecology

## 2017-08-28 ENCOUNTER — Encounter (HOSPITAL_COMMUNITY): Payer: Self-pay | Admitting: Advanced Practice Midwife

## 2017-08-28 DIAGNOSIS — O0990 Supervision of high risk pregnancy, unspecified, unspecified trimester: Secondary | ICD-10-CM

## 2017-08-28 DIAGNOSIS — R109 Unspecified abdominal pain: Secondary | ICD-10-CM | POA: Diagnosis present

## 2017-08-28 DIAGNOSIS — O9989 Other specified diseases and conditions complicating pregnancy, childbirth and the puerperium: Secondary | ICD-10-CM

## 2017-08-28 DIAGNOSIS — Z3A38 38 weeks gestation of pregnancy: Secondary | ICD-10-CM | POA: Insufficient documentation

## 2017-08-28 DIAGNOSIS — O2243 Hemorrhoids in pregnancy, third trimester: Secondary | ICD-10-CM | POA: Diagnosis not present

## 2017-08-28 DIAGNOSIS — M549 Dorsalgia, unspecified: Secondary | ICD-10-CM

## 2017-08-28 DIAGNOSIS — O26893 Other specified pregnancy related conditions, third trimester: Secondary | ICD-10-CM | POA: Insufficient documentation

## 2017-08-28 DIAGNOSIS — Z0371 Encounter for suspected problem with amniotic cavity and membrane ruled out: Secondary | ICD-10-CM

## 2017-08-28 DIAGNOSIS — O09519 Supervision of elderly primigravida, unspecified trimester: Secondary | ICD-10-CM

## 2017-08-28 LAB — AMNISURE RUPTURE OF MEMBRANE (ROM) NOT AT ARMC: Amnisure ROM: NEGATIVE

## 2017-08-28 MED ORDER — LIDOCAINE HCL 2 % EX GEL
1.0000 "application " | Freq: Once | CUTANEOUS | Status: AC
  Administered 2017-08-28: 1 via TOPICAL
  Filled 2017-08-28: qty 5

## 2017-08-28 NOTE — MAU Note (Signed)
hemorrhoids have been a problem this past wk.  Started with a suppository, using Preparation H inside/out; tucks wipes. Stool softeners; they are getting worse.  Also having cramping in lower abd.  Yesterday, noted some ? Leakage, small amts of clear fluid with movement.

## 2017-08-28 NOTE — MAU Provider Note (Signed)
Chief Complaint  Patient presents with  . Abdominal Pain  . Hemorrhoids     First Provider Initiated Contact with Patient 08/28/17 1655      S: Heather AcostaDeborah E Christian  is a 36 y.o. y.o. year old [id number] female at 5132w6d weeks gestation who presents to MAU reporting leaking of clear fluid since  Last night and worsening pain from hemorrhoids. No improvement w/ Preparation H, Anusol HC. Nml BMs.   Contractions: Mild-mod, irreg. Increased pelvic pressure Vaginal bleeding: Denies Fetal movement: Nml  O: Patient Vitals for the past 24 hrs:  BP Temp Temp src Pulse Resp Height Weight  08/28/17 1644 129/72 98 F (36.7 C) Oral 86 18 5\' 4"  (1.626 m) 222 lb (100.7 kg)   General: NAD Heart: Regular rate Lungs: Normal rate and effort Abd: Soft, NT, Gravid, S=D Pelvic: NEFG, Neg pooling w/ valsalva, no blood.  Rectal: Three tender hemorrhoids w/ slight purple discoloration at the base of largest hemorrhoid, but no palpable thrombus.  Dilation: 3 Effacement (%): Thick Cervical Position: Middle Station: -2 Presentation: Vertex Exam by:: v Luka Stohr cnm  EFM: 135, Moderate variability, 15 x 15 accelerations, no decelerations Toco: irreg, mild  Neg Fern Neg Amnisure  A: 3732w6d week IUP No evidence of SROM, False labor Several large, tender hemorrhoids w/out evidence of thrombosis  FHR reactive  P: Discharge home in stable condition. Labor precautions and fetal kick counts. Topical lidocaine for hemorrhoids. Recommend F/U w/ [facility] Postpartum for definitive management of hemorrhoids Follow-up as scheduled for prenatal visit or sooner as needed if symptoms worsen. Return to maternity admissions as needed if symptoms worsen.  Katrinka BlazingSmith, [place], [place] 08/28/2017 6:43 PM  2

## 2017-08-28 NOTE — Discharge Instructions (Signed)
Nonsurgical Procedures for Hemorrhoids  Nonsurgical procedures can be used to treat hemorrhoids. Hemorrhoids are swollen veins that are inside the rectum (internal hemorrhoids) or around the anus (external hemorrhoids). They are caused by increased pressure in the anal area. This pressure may result from straining to have a bowel movement (constipation), diarrhea, pregnancy, obesity, anal sex, or sitting for long periods of time.  Hemorrhoids can cause symptoms such as pain and bleeding. Various procedures may be performed if diet changes, lifestyle changes, and other treatments do not help your symptoms. Some of these procedures do not involve surgery. Three common nonsurgical procedures are:  · Rubber band ligation. Rubber bands are used to cut off the blood supply to the hemorrhoids.  · Sclerotherapy. Medicine is injected into the hemorrhoids to shrink them.  · Infrared coagulation. A type of light energy is used to get rid of the hemorrhoids.    Tell a health care provider about:  · Any allergies you have.  · All medicines you are taking, including vitamins, herbs, eye drops, creams, and over-the-counter medicines.  · Any problems you or family members have had with anesthetic medicines.  · Any blood disorders you have.  · Any surgeries you have had.  · Any medical conditions you have.  · Whether you are pregnant or may be pregnant.  What are the risks?  Generally, this is a safe procedure. However, problems may occur, including:  · Infection.  · Bleeding.  · Pain.    What happens before the procedure?  · Ask your health care provider about:  ? Changing or stopping your regular medicines. This is especially important if you are taking diabetes medicines or blood thinners.  ? Taking medicines such as aspirin and ibuprofen. These medicines can thin your blood. Do not take these medicines before your procedure if your health care provider instructs you not to.  · You may need to have a procedure to examine the  inside of your colon with a scope (colonoscopy). Your health care provider may do this to make sure that there are no other causes for your bleeding or pain.  What happens during the procedure?  · Your health care provider will clean your rectal area with a rinsing solution.  · A lubricating jelly may be placed into your rectum. The jelly may contain a medicine to numb the area (local anesthetic).  · Your health care provider will insert a short scope (anoscope) into your rectum to examine the hemorrhoids.  · One of the following techniques will be used.  Rubber Band Ligation  Your health care provider will place medical instruments through the scope to put rubber bands around the base of your hemorrhoids. The bands will cut off the blood supply to the hemorrhoids. The hemorrhoids will fall off after several days.  Sclerotherapy  Your health care provider will inject medicine through the scope into your hemorrhoids. This will cause them to shrink and dry up.  Infrared Coagulation  Your health care provider will shine a type of light through the scope onto your hemorrhoids. This light will generate energy (infrared radiation). It will cause the hemorrhoids to scar and then fall off.  Each of these procedures may vary among health care providers and hospitals.  What happens after the procedure?  · You will be monitored to make sure that you have no bleeding.  · Return to your normal activities as told by your health care provider.  This information is not intended to replace   advice given to you by your health care provider. Make sure you discuss any questions you have with your health care provider.  Document Released: 08/15/2009 Document Revised: 03/25/2016 Document Reviewed: 01/13/2015  Elsevier Interactive Patient Education © 2018 Elsevier Inc.    Surgical Procedures for Hemorrhoids  Surgical procedures can be used to treat hemorrhoids. Hemorrhoids are swollen veins that are inside the rectum (internal hemorrhoids)  or around the anus (external hemorrhoids). They are caused by increased pressure in the anal area. This pressure may result from straining to have a bowel movement (constipation), diarrhea, pregnancy, obesity, anal sex, or sitting for long periods of time.  Hemorrhoids can cause symptoms such as pain and bleeding. Surgery may be needed if diet changes, lifestyle changes, and other treatments do not help your symptoms. Various surgical methods may be used. Three common methods are:  · Closed hemorrhoidectomy. The hemorrhoids are surgically removed, and the surgical cuts (incisions) are closed with stitches (sutures).  · Open hemorrhoidectomy. The hemorrhoids are surgically removed, but the incisions are allowed to heal without sutures.  · Stapled hemorrhoidopexy. The hemorrhoids are removed using a device that takes out a ring of excess tissue.    Tell a health care provider about:  · Any allergies you have.  · All medicines you are taking, including vitamins, herbs, eye drops, creams, and over-the-counter medicines.  · Any problems you or family members have had with anesthetic medicines.  · Any blood disorders you have.  · Any surgeries you have had.  · Any medical conditions you have.  · Whether you are pregnant or may be pregnant.  What are the risks?  Generally, this is a safe procedure. However, problems may occur, including:  · Infection.  · Bleeding.  · Allergic reactions to medicines.  · Damage to other structures or organs.  · Pain.  · Constipation.  · Difficulty passing urine.  · Narrowing of the anal canal (stenosis).  · Difficulty controlling bowel movements (incontinence).    What happens before the procedure?  · Ask your health care provider about:  ? Changing or stopping your regular medicines. This is especially important if you are taking diabetes medicines or blood thinners.  ? Taking medicines such as aspirin and ibuprofen. These medicines can thin your blood. Do not take these medicines before  your procedure if your health care provider instructs you not to.  · You may need to have a procedure to examine the inside of your colon with a scope (colonoscopy). Your health care provider may do this to make sure that there are no other causes for your bleeding or pain.  · Follow instructions from your health care provider about eating or drinking restrictions.  · You may be instructed to take a laxative and an enema to clean out your colon before surgery (bowel prep). Carefully follow instructions from your health care provider about bowel prep.  · Ask your health care provider how your surgical site will be marked or identified.  · You may be given antibiotic medicine to help prevent infection.  · Plan to have someone take you home after the procedure.  What happens during the procedure?  · To reduce your risk of infection:  ? Your health care team will wash or sanitize their hands.  ? Your skin will be washed with soap.  · An IV tube will be inserted into one of your veins.  · You will be given one or more of the following:  ?   A medicine to help you relax (sedative).  ? A medicine to numb the area (local anesthetic).  ? A medicine to make you fall asleep (general anesthetic).  ? A medicine that is injected into an area of your body to numb everything below the injection site (regional anesthetic).  · A lubricating jelly may be placed into your rectum.  · Your surgeon will insert a short scope (anoscope) into your rectum to examine the hemorrhoids.  · One of the following hemorrhoid procedures will be performed.  Closed Hemorrhoidectomy  · Your surgeon will use surgical instruments to open the tissue around the hemorrhoids.  · The veins that supply the hemorrhoids will be tied off with a suture.  · The hemorrhoids will be removed.  · The tissue that surrounds the hemorrhoids will be closed with sutures that your body can absorb (absorbable sutures).  Open Hemorrhoidectomy  · The hemorrhoids will be removed  with surgical instruments.  · The incisions will be left open to heal without sutures.  Stapled Hemorrhoidopexy  · Your surgeon will use a circular stapling device to remove the hemorrhoids.  · The device will be inserted into your anus. It will remove a circular ring of tissue that includes hemorrhoid tissue and some tissue above the hemorrhoids.  · The staples in the device will close the edges of removed tissue. This will cut off the blood supply to the hemorrhoids and will pull any remaining hemorrhoids back into place.  Each of these procedures may vary among health care providers and hospitals.  What happens after the procedure?  · Your blood pressure, heart rate, breathing rate, and blood oxygen level will be monitored often until the medicines you were given have worn off.  · You will be given pain medicine as needed.  This information is not intended to replace advice given to you by your health care provider. Make sure you discuss any questions you have with your health care provider.  Document Released: 08/15/2009 Document Revised: 03/25/2016 Document Reviewed: 01/13/2015  Elsevier Interactive Patient Education © 2018 Elsevier Inc.

## 2017-08-28 NOTE — MAU Note (Signed)
Urine in lab 

## 2017-08-29 ENCOUNTER — Ambulatory Visit (INDEPENDENT_AMBULATORY_CARE_PROVIDER_SITE_OTHER): Payer: Medicaid Other | Admitting: Obstetrics and Gynecology

## 2017-08-29 VITALS — BP 131/81 | HR 84 | Wt 220.0 lb

## 2017-08-29 DIAGNOSIS — O09519 Supervision of elderly primigravida, unspecified trimester: Secondary | ICD-10-CM

## 2017-08-29 DIAGNOSIS — O0990 Supervision of high risk pregnancy, unspecified, unspecified trimester: Secondary | ICD-10-CM

## 2017-08-29 DIAGNOSIS — Z9889 Other specified postprocedural states: Secondary | ICD-10-CM

## 2017-08-29 DIAGNOSIS — O0993 Supervision of high risk pregnancy, unspecified, third trimester: Secondary | ICD-10-CM

## 2017-08-29 DIAGNOSIS — O09513 Supervision of elderly primigravida, third trimester: Secondary | ICD-10-CM

## 2017-08-29 DIAGNOSIS — O2243 Hemorrhoids in pregnancy, third trimester: Secondary | ICD-10-CM

## 2017-08-29 NOTE — Progress Notes (Signed)
Prenatal Visit Note Date: 08/29/2017 Clinic: [facility]  Subjective:  Heather AcostaDeborah E Christian is a 36 y.o. [id number] at 7856w0d being seen today for ongoing prenatal care.  She is currently monitored for the following issues for this high-risk pregnancy and has Cervical dysplasia; Supervision of high risk pregnancy, antepartum; History of loop electrical excision procedure (LEEP); Advanced maternal age, primigravida, antepartum; Obesity, Class II, BMI 35-39.9; Obesity in pregnancy; Back pain affecting pregnancy; and Hemorrhoids during pregnancy in third trimester on her problem list.  Patient reports hemorrhoids.   Contractions: Not present. Vag. Bleeding: None.  Movement: Present. Denies leaking of fluid.   The following portions of the patient's history were reviewed and updated as appropriate: allergies, current medications, past family history, past medical history, past social history, past surgical history and problem list. Problem list updated.  Objective:   Vitals:   08/29/17 1543  BP: 131/81  Pulse: 84  Weight: 220 lb (99.8 kg)    Fetal Status: Fetal Heart Rate (bpm): 128 Fundal Height: 39 cm Movement: Present  Presentation: Vertex  General:  Alert, oriented and cooperative. Patient is in no acute distress.  Skin: Skin is warm and dry. No rash noted.   Cardiovascular: Normal heart rate noted  Respiratory: Normal respiratory effort, no problems with respiration noted  Abdomen: Soft, gravid, appropriate for gestational age. Pain/Pressure: Present     Pelvic:  Cervical exam performed Dilation: 4 Effacement (%): 50 Station: -2  Extremities: Normal range of motion.  Edema: None  Mental Status: Normal mood and affect. Normal behavior. Normal judgment and thought content.   Rectal: three hemorrhoids, mild to moderately ttp with one large (2x2cm), one medium and one small. Pt able to sit and walk w/o difficulty. No e/o thrombosis.   Urinalysis:      Assessment and  Plan:  Pregnancy: [id number] at 2456w0d  1. Supervision of high risk pregnancy, antepartum Routine care. BTL papers already signed. Offered to pt and membranes stripped today.   2. Hemorrhoids during pregnancy in third trimester D/w pt that could look into elective IOL since >39wks but pt declines for now. Doesn't matter if works or not b/c they are worse by the end of the day. D/w her to continue with topical methods and her BM regimen and continue with supportive care for now. Pt told that if she changes her mind and would like us to see if we can set up an elective IOL to let us know. Small c/s risk d/w her and need for interventions during induction  3. Advanced maternal age, primigravida, antepartum No issues  4. History of loop electrical excision procedure (LEEP)  Term labor symptoms and general obstetric precautions including but not limited to vaginal bleeding, contractions, leaking of fluid and fetal movement were reviewed in detail with the patient. Please refer to After Visit Summary for other counseling recommendations.  Return in about 4 days (around 09/02/2017) for rob.   Raeford BingPickens, Tine Mabee, MD

## 2017-08-30 ENCOUNTER — Encounter (HOSPITAL_COMMUNITY): Payer: Self-pay

## 2017-08-30 ENCOUNTER — Inpatient Hospital Stay (HOSPITAL_COMMUNITY)
Admission: AD | Admit: 2017-08-30 | Discharge: 2017-08-30 | Disposition: A | Discharge status: Home / Self Care | Payer: Medicaid Other | Source: Ambulatory Visit | Attending: Obstetrics & Gynecology | Admitting: Obstetrics & Gynecology

## 2017-08-30 DIAGNOSIS — O471 False labor at or after 37 completed weeks of gestation: Secondary | ICD-10-CM | POA: Insufficient documentation

## 2017-08-30 DIAGNOSIS — Z3A39 39 weeks gestation of pregnancy: Secondary | ICD-10-CM | POA: Insufficient documentation

## 2017-08-30 NOTE — MAU Note (Signed)
Pt presents to MAU with c/o of contractions that started at 0430 today and has had bloody show this morning. Pt denies LOF. +FM

## 2017-08-30 NOTE — Discharge Instructions (Signed)

## 2017-09-02 ENCOUNTER — Inpatient Hospital Stay (HOSPITAL_COMMUNITY): Payer: Medicaid Other | Admitting: Anesthesiology

## 2017-09-02 ENCOUNTER — Ambulatory Visit (INDEPENDENT_AMBULATORY_CARE_PROVIDER_SITE_OTHER): Payer: Medicaid Other | Admitting: Obstetrics & Gynecology

## 2017-09-02 ENCOUNTER — Telehealth (HOSPITAL_COMMUNITY): Payer: Self-pay | Admitting: *Deleted

## 2017-09-02 ENCOUNTER — Inpatient Hospital Stay (HOSPITAL_COMMUNITY)
Admission: AD | Admit: 2017-09-02 | Discharge: 2017-09-05 | DRG: 798 | Disposition: A | Discharge status: Home / Self Care | Payer: Medicaid Other | Source: Ambulatory Visit | Attending: Obstetrics and Gynecology | Admitting: Obstetrics and Gynecology

## 2017-09-02 ENCOUNTER — Encounter (HOSPITAL_COMMUNITY): Payer: Self-pay | Admitting: *Deleted

## 2017-09-02 VITALS — BP 111/75 | HR 83 | Wt 219.0 lb

## 2017-09-02 DIAGNOSIS — Z302 Encounter for sterilization: Secondary | ICD-10-CM

## 2017-09-02 DIAGNOSIS — O2243 Hemorrhoids in pregnancy, third trimester: Secondary | ICD-10-CM | POA: Diagnosis present

## 2017-09-02 DIAGNOSIS — O0990 Supervision of high risk pregnancy, unspecified, unspecified trimester: Secondary | ICD-10-CM

## 2017-09-02 DIAGNOSIS — E669 Obesity, unspecified: Secondary | ICD-10-CM

## 2017-09-02 DIAGNOSIS — Z3483 Encounter for supervision of other normal pregnancy, third trimester: Secondary | ICD-10-CM | POA: Diagnosis present

## 2017-09-02 DIAGNOSIS — Z3A39 39 weeks gestation of pregnancy: Secondary | ICD-10-CM | POA: Diagnosis not present

## 2017-09-02 DIAGNOSIS — O09519 Supervision of elderly primigravida, unspecified trimester: Secondary | ICD-10-CM

## 2017-09-02 DIAGNOSIS — O99214 Obesity complicating childbirth: Secondary | ICD-10-CM | POA: Diagnosis present

## 2017-09-02 DIAGNOSIS — O09513 Supervision of elderly primigravida, third trimester: Secondary | ICD-10-CM

## 2017-09-02 DIAGNOSIS — Z87410 Personal history of cervical dysplasia: Secondary | ICD-10-CM | POA: Diagnosis not present

## 2017-09-02 DIAGNOSIS — Z87891 Personal history of nicotine dependence: Secondary | ICD-10-CM

## 2017-09-02 DIAGNOSIS — O9989 Other specified diseases and conditions complicating pregnancy, childbirth and the puerperium: Secondary | ICD-10-CM

## 2017-09-02 DIAGNOSIS — M549 Dorsalgia, unspecified: Secondary | ICD-10-CM

## 2017-09-02 DIAGNOSIS — O0993 Supervision of high risk pregnancy, unspecified, third trimester: Secondary | ICD-10-CM

## 2017-09-02 DIAGNOSIS — E66812 Obesity, class 2: Secondary | ICD-10-CM

## 2017-09-02 LAB — TYPE AND SCREEN
ABO/RH(D): O POS
ANTIBODY SCREEN: NEGATIVE

## 2017-09-02 LAB — CBC
HEMATOCRIT: 34.5 % — AB (ref 36.0–46.0)
HEMOGLOBIN: 11.5 g/dL — AB (ref 12.0–15.0)
MCH: 28 pg (ref 26.0–34.0)
MCHC: 33.3 g/dL (ref 30.0–36.0)
MCV: 83.9 fL (ref 78.0–100.0)
Platelets: 276 10*3/uL (ref 150–400)
RBC: 4.11 MIL/uL (ref 3.87–5.11)
RDW: 14.9 % (ref 11.5–15.5)
WBC: 11.9 10*3/uL — ABNORMAL HIGH (ref 4.0–10.5)

## 2017-09-02 MED ORDER — EPHEDRINE 5 MG/ML INJ
10.0000 mg | INTRAVENOUS | Status: DC | PRN
  Filled 2017-09-02: qty 2

## 2017-09-02 MED ORDER — OXYTOCIN 40 UNITS IN LACTATED RINGERS INFUSION - SIMPLE MED
2.5000 [IU]/h | INTRAVENOUS | Status: DC
  Filled 2017-09-02: qty 1000

## 2017-09-02 MED ORDER — LACTATED RINGERS IV SOLN
INTRAVENOUS | Status: DC
  Administered 2017-09-02 – 2017-09-03 (×2): via INTRAVENOUS

## 2017-09-02 MED ORDER — TERBUTALINE SULFATE 1 MG/ML IJ SOLN
0.2500 mg | Freq: Once | INTRAMUSCULAR | Status: DC | PRN
  Filled 2017-09-02: qty 1

## 2017-09-02 MED ORDER — FENTANYL CITRATE (PF) 100 MCG/2ML IJ SOLN
100.0000 ug | INTRAMUSCULAR | Status: DC | PRN
  Administered 2017-09-02: 100 ug via INTRAVENOUS
  Filled 2017-09-02: qty 2

## 2017-09-02 MED ORDER — LACTATED RINGERS IV SOLN
500.0000 mL | INTRAVENOUS | Status: DC | PRN
  Administered 2017-09-03: 500 mL via INTRAVENOUS

## 2017-09-02 MED ORDER — OXYTOCIN 40 UNITS IN LACTATED RINGERS INFUSION - SIMPLE MED
1.0000 m[IU]/min | INTRAVENOUS | Status: DC
  Administered 2017-09-02: 2 m[IU]/min via INTRAVENOUS

## 2017-09-02 MED ORDER — LIDOCAINE HCL (PF) 1 % IJ SOLN
INTRAMUSCULAR | Status: DC | PRN
  Administered 2017-09-02: 13 mL via EPIDURAL

## 2017-09-02 MED ORDER — LACTATED RINGERS IV SOLN
500.0000 mL | Freq: Once | INTRAVENOUS | Status: AC
  Administered 2017-09-02: 22:00:00 via INTRAVENOUS

## 2017-09-02 MED ORDER — ACETAMINOPHEN 325 MG PO TABS
650.0000 mg | ORAL_TABLET | ORAL | Status: DC | PRN
  Administered 2017-09-03: 650 mg via ORAL
  Filled 2017-09-02: qty 2

## 2017-09-02 MED ORDER — OXYCODONE-ACETAMINOPHEN 5-325 MG PO TABS: 2.0000 | ORAL_TABLET | ORAL | Status: DC | PRN

## 2017-09-02 MED ORDER — ONDANSETRON HCL 4 MG/2ML IJ SOLN: 4.0000 mg | Freq: Four times a day (QID) | INTRAMUSCULAR | Status: DC | PRN

## 2017-09-02 MED ORDER — PHENYLEPHRINE 40 MCG/ML (10ML) SYRINGE FOR IV PUSH (FOR BLOOD PRESSURE SUPPORT)
80.0000 ug | PREFILLED_SYRINGE | INTRAVENOUS | Status: DC | PRN
  Filled 2017-09-02: qty 5

## 2017-09-02 MED ORDER — FLEET ENEMA 7-19 GM/118ML RE ENEM: 1.0000 | ENEMA | RECTAL | Status: DC | PRN

## 2017-09-02 MED ORDER — OXYTOCIN BOLUS FROM INFUSION
500.0000 mL | Freq: Once | INTRAVENOUS | Status: AC
Start: 2017-09-02 — End: 2017-09-03
  Administered 2017-09-03: 500 mL/h via INTRAVENOUS

## 2017-09-02 MED ORDER — OXYCODONE-ACETAMINOPHEN 5-325 MG PO TABS: 1.0000 | ORAL_TABLET | ORAL | Status: DC | PRN

## 2017-09-02 MED ORDER — LIDOCAINE HCL (PF) 1 % IJ SOLN
30.0000 mL | INTRAMUSCULAR | Status: DC | PRN
  Filled 2017-09-02: qty 30

## 2017-09-02 MED ORDER — DIPHENHYDRAMINE HCL 50 MG/ML IJ SOLN: 12.5000 mg | INTRAMUSCULAR | Status: DC | PRN

## 2017-09-02 MED ORDER — PHENYLEPHRINE 40 MCG/ML (10ML) SYRINGE FOR IV PUSH (FOR BLOOD PRESSURE SUPPORT)
80.0000 ug | PREFILLED_SYRINGE | INTRAVENOUS | Status: DC | PRN
  Filled 2017-09-02: qty 5
  Filled 2017-09-02: qty 10

## 2017-09-02 MED ORDER — FENTANYL 2.5 MCG/ML BUPIVACAINE 1/10 % EPIDURAL INFUSION (WH - ANES)
14.0000 mL/h | INTRAMUSCULAR | Status: DC | PRN
  Administered 2017-09-02: 14 mL/h via EPIDURAL
  Filled 2017-09-02: qty 100

## 2017-09-02 MED ORDER — SOD CITRATE-CITRIC ACID 500-334 MG/5ML PO SOLN: 30.0000 mL | ORAL | Status: DC | PRN

## 2017-09-02 NOTE — Progress Notes (Signed)
   PRENATAL VISIT NOTE  Subjective:  Johnna AcostaDeborah E Feldkamp is a 36 y.o. [id number] at [redacted]w[redacted]d being seen today for ongoing prenatal care.  She is currently monitored for the following issues for this high-risk pregnancy and has Cervical dysplasia; Supervision of high risk pregnancy, antepartum; History of loop electrical excision procedure (LEEP); Advanced maternal age, primigravida, antepartum; Obesity, Class II, BMI 35-39.9; Obesity in pregnancy; Back pain affecting pregnancy; and Hemorrhoids during pregnancy in third trimester on her problem list.  Patient reports no complaints.  Contractions: Irregular. Vag. Bleeding: None.  Movement: Present. Denies leaking of fluid.   The following portions of the patient's history were reviewed and updated as appropriate: allergies, current medications, past family history, past medical history, past social history, past surgical history and problem list. Problem list updated.  Objective:   Vitals:   09/02/17 0941  BP: (!) 141/76  Pulse: 79  Weight: 219 lb (99.3 kg)    Fetal Status: Fetal Heart Rate (bpm): 138   Movement: Present     General:  Alert, oriented and cooperative. Patient is in no acute distress.  Skin: Skin is warm and dry. No rash noted.   Cardiovascular: Normal heart rate noted  Respiratory: Normal respiratory effort, no problems with respiration noted  Abdomen: Soft, gravid, appropriate for gestational age.  Pain/Pressure: Present     Pelvic: Cervical exam performed        Extremities: Normal range of motion.  Edema: None  Mental Status:  Normal mood and affect. Normal behavior. Normal judgment and thought content.   Assessment and Plan:  Pregnancy: [id number] at 592w4d  1. Supervision of high risk pregnancy, antepartum - IOL for tomorrow at midnight 2. Obesity, Class II, BMI 35-39.9  3. Advanced maternal age, primigravida, antepartum   Term labor symptoms and general obstetric precautions including but not limited to vaginal  bleeding, contractions, leaking of fluid and fetal movement were reviewed in detail with the patient. Please refer to After Visit Summary for other counseling recommendations.  No Follow-up on file.   Allie BossierMyra C Shawn Dannenberg, MD

## 2017-09-02 NOTE — MAU Note (Signed)
Pt to be evaluated in room 167 per Cleone SlimH. Mitchell RN.

## 2017-09-02 NOTE — Anesthesia Preprocedure Evaluation (Signed)
Anesthesia Evaluation  Patient identified by MRN, date of birth, ID band Patient awake    Reviewed: Allergy & Precautions, NPO status , Patient's Chart, lab work & pertinent test results  Airway Mallampati: II  TM Distance: >3 FB Neck ROM: Full    Dental no notable dental hx.    Pulmonary neg pulmonary ROS, former smoker,    Pulmonary exam normal breath sounds clear to auscultation       Cardiovascular negative cardio ROS Normal cardiovascular exam Rhythm:Regular Rate:Normal     Neuro/Psych negative neurological ROS  negative psych ROS   GI/Hepatic negative GI ROS, Neg liver ROS,   Endo/Other  negative endocrine ROS  Renal/GU negative Renal ROS  negative genitourinary   Musculoskeletal negative musculoskeletal ROS (+)   Abdominal   Peds negative pediatric ROS (+)  Hematology negative hematology ROS (+)   Anesthesia Other Findings   Reproductive/Obstetrics negative OB ROS (+) Pregnancy                             Anesthesia Physical Anesthesia Plan  ASA: II  Anesthesia Plan: Epidural   Post-op Pain Management:    Induction:   PONV Risk Score and Plan:   Airway Management Planned:   Additional Equipment:   Intra-op Plan:   Post-operative Plan:   Informed Consent:   Plan Discussed with:   Anesthesia Plan Comments:         Anesthesia Quick Evaluation  

## 2017-09-02 NOTE — Telephone Encounter (Signed)
Preadmission screen  

## 2017-09-02 NOTE — H&P (Signed)
LABOR AND DELIVERY ADMISSION HISTORY AND PHYSICAL NOTE  Heather AcostaDeborah E Christian is a 36 y.o. female [id number] with IUP at 4324w4d by  by U/S presenting for SOL. She reports positive fetal movement. She denies leakage of fluid or vaginal bleeding.  Denies any fevers, recent sickness, headache, blurry vision, abdominal pain, nausea, vomiting, pain or burning on urination, constipation.  She does endorse bilateral lower extremity edema that has been present for several months. She does endorse having one day of diarrhea yesterday that has since resolved.  Prenatal History/Complications: [facility] Pregnancy complications:  - Obesity in pregnancy, Hx of LEEP procedure, AMA  Past Medical History: Past Medical History:  Diagnosis Date  . Abnormal Pap smear    LEEP 2008  . Overactive bladder   . Vaginal Pap smear, abnormal     Past Surgical History:  Past Surgical History:  Procedure Laterality Date  . INCONTINENCE SURGERY    . LEEP    . URETHRAL SLING    . WISDOM TOOTH EXTRACTION      Obstetrical History: OB History    Gravida Para Term Preterm AB Living   7 2 2  0 4 2   SAB TAB Ectopic Multiple Live Births   4 0 0 0 2      Obstetric Comments   G1 and G2: early SAB G3: 09/2002 6lbs SVD G4: SAB G5: 01/2008 7lbs 7oz SVD G6: 10/2016 early SAB      Social History: Social History   Social History  . Marital status: Married    Spouse name: N/A  . Number of children: N/A  . Years of education: N/A   Social History Main Topics  . Smoking status: Former Games developermoker  . Smokeless tobacco: Never Used  . Alcohol use Yes     Comment: red wine every now and then- before and during preg  . Drug use: No  . Sexual activity: Yes    Birth control/ protection: None   Other Topics Concern  . None   Social History Narrative  . None    Family History: Family History  Problem Relation Age of Onset  . Nephrolithiasis Mother   . Alcohol abuse Father        recovering  .  Hypertension Father   . Heart disease Father        heart attack    Allergies: Allergies  Allergen Reactions  . Azithromycin Other (See Comments)    Reaction:  Abdominal pain   . Erythromycin Other (See Comments)    Reaction:  Abdominal pain     Prescriptions Prior to Admission  Medication Sig Dispense Refill Last Dose  . acetaminophen (TYLENOL) 325 MG tablet Take 650 mg by mouth every 6 (six) hours as needed for mild pain, moderate pain, fever or headache.   Taking  . calcium carbonate (TUMS - DOSED IN MG ELEMENTAL CALCIUM) 500 MG chewable tablet Chew 2-3 tablets by mouth every 2 (two) hours as needed for indigestion or heartburn.   Taking  . cetirizine (ZYRTEC) 10 MG tablet Take 10 mg by mouth daily as needed for allergies.   Taking  . fluticasone (FLONASE) 50 MCG/ACT nasal spray Place 1-2 sprays into both nostrils daily as needed for rhinitis.    Taking  . hydrocortisone (ANUSOL-HC) 25 MG suppository Place 1 suppository (25 mg total) rectally 2 (two) times daily as needed for hemorrhoids or anal itching. 24 suppository 3 Taking  . omeprazole (PRILOSEC) 20 MG capsule TAKE 1 CAPSULE BY MOUTH  TWICE DAILY BEFORE A MEAL 180 capsule 0 Taking  . phenylephrine-shark liver oil-mineral oil-petrolatum (PREPARATION H) 0.25-3-14-71.9 % rectal ointment Place 1 application rectally 3 (three) times daily as needed for hemorrhoids.   Taking  . Prenatal Vit-Fe Fumarate-FA (PRENATAL MULTIVITAMIN) TABS tablet Take 1 tablet by mouth daily.    Taking     Review of Systems  All systems reviewed and negative except as stated in HPI  Physical Exam Blood pressure 125/72, pulse 88, temperature (!) 97.2 F (36.2 C), temperature source Oral, resp. rate 18, height 5\' 4"  (1.626 m), weight 222 lb (100.7 kg), last menstrual period 11/07/2016, unknown if currently breastfeeding. General appearance: alert, cooperative and no distress Lungs: clear to auscultation bilaterally Heart: regular rate and  rhythm Abdomen: soft, non-tender; bowel sounds normal Extremities: No calf swelling or tenderness, bilateral +1 pitting edema of lower extremities Presentation: cephalic Fetal monitoring: 135bpm, moderate activity, accelerations present, no decelerations Uterine activity: irregular ctx every 2-4 minutes Dilation: 5.5 Effacement (%): 60 Station: -2 Exam by:: Dr. Doroteo Glassman   Prenatal labs: ABO, Rh: O/Positive/-- (04/27 1043) Antibody: Negative (04/27 1043) Rubella: 2.01 (04/27 1043) RPR: Non Reactive (08/08 1017)  HBsAg: Negative (04/27 1043)  HIV:   non-reactive GC/Chlamydia: neg on 08/08/17 GBS:   negative 1 hr Glucola: Third Trimester was wnl Genetic screening:  NIPS low risk Anatomy US: normal  Prenatal Transfer Tool  Maternal Diabetes: No Genetic Screening: Normal Maternal Ultrasounds/Referrals: Normal Fetal Ultrasounds or other Referrals:  None Maternal Substance Abuse:  Yes:  Type: Other: endorses some ETOH use during pregnancy Significant Maternal Medications:  Meds include: Other: Omeprazole 20mg  BID Significant Maternal Lab Results: Lab values include: Group B Strep negative  Results for orders placed or performed during the hospital encounter of 09/02/17 (from the past 24 hour(s))  CBC   Collection Time: 09/02/17  8:00 PM  Result Value Ref Range   WBC 11.9 (H) 4.0 - 10.5 K/uL   RBC 4.11 3.87 - 5.11 MIL/uL   Hemoglobin 11.5 (L) 12.0 - 15.0 g/dL   HCT 16.1 (L) 09.6 - 04.5 %   MCV 83.9 78.0 - 100.0 fL   MCH 28.0 26.0 - 34.0 pg   MCHC 33.3 30.0 - 36.0 g/dL   RDW 40.9 81.1 - 91.4 %   Platelets 276 150 - 400 K/uL    Patient Active Problem List   Diagnosis Date Noted  . Labor and delivery, indication for care 09/02/2017  . Hemorrhoids during pregnancy in third trimester 08/22/2017  . Back pain affecting pregnancy 06/08/2017  . Obesity, Class II, BMI 35-39.9 02/26/2017  . Obesity in pregnancy 02/26/2017  . Supervision of high risk pregnancy, antepartum 02/25/2017   . History of loop electrical excision procedure (LEEP) 02/25/2017  . Advanced maternal age, primigravida, antepartum 02/25/2017  . Cervical dysplasia 09/01/2011    Assessment: Heather Christian is a 36 y.o. [id number] at [redacted]w[redacted]d here for SOL.   #Labor: Continue to monitor for adequate contractions; consider starting Pitocin if contractions are not adequate and she stops making adequate change. #Pain: Epidural #FWB: Cat I #ID:  GBS neg #MOF: Bottle #MOC: BTL (papers signed) #Circ:  Boy(out patient circ)   Arlyce Harman 09/02/2017, 9:16 PM  CNM attestation:  I have seen and examined this patient; I agree with above documentation in the resident's note.   Heather Christian is a 36 y.o. [id number] here for SOL  PE: BP 125/72   Pulse 88   Temp (!) 97.2 F (36.2 C) (Oral)  Resp 18   Ht 5\' 4"  (1.626 m)   Wt 100.7 kg (222 lb)   LMP 11/07/2016   BMI 38.11 kg/m   Resp: normal effort, no distress Abd: gravid  ROS, labs, PMH reviewed  Plan: Admit to [facility] Expectant management (may use Pit/AROM later) Epidural prn Anticipate SVD Plan for ppBTL  Cam Hai CNM 09/02/2017, 11:12 PM

## 2017-09-02 NOTE — Anesthesia Procedure Notes (Signed)
Epidural Patient location during procedure: OB Start time: 09/02/2017 10:33 PM End time: 09/02/2017 10:47 PM  Staffing Anesthesiologist: Anitra LauthMILLER, Alvaro Aungst RAY Performed: anesthesiologist   Preanesthetic Checklist Completed: patient identified, site marked, surgical consent, pre-op evaluation, timeout performed, IV checked, risks and benefits discussed and monitors and equipment checked  Epidural Patient position: sitting Prep: ChloraPrep Patient monitoring: heart rate, cardiac monitor, continuous pulse ox and blood pressure Approach: midline Location: L2-L3 Injection technique: LOR saline  Needle:  Needle type: Tuohy  Needle gauge: 17 G Needle length: 9 cm Needle insertion depth: 5 cm Catheter type: closed end flexible Catheter size: 20 Guage Catheter at skin depth: 8 cm Test dose: negative  Assessment Events: blood not aspirated, injection not painful, no injection resistance, negative IV test and no paresthesia  Additional Notes Reason for block:procedure for pain

## 2017-09-03 ENCOUNTER — Encounter (HOSPITAL_COMMUNITY): Payer: Self-pay | Admitting: General Practice

## 2017-09-03 DIAGNOSIS — Z3A39 39 weeks gestation of pregnancy: Secondary | ICD-10-CM

## 2017-09-03 LAB — RPR: RPR: NONREACTIVE

## 2017-09-03 LAB — ABO/RH: ABO/RH(D): O POS

## 2017-09-03 MED ORDER — BENZOCAINE-MENTHOL 20-0.5 % EX AERO
1.0000 "application " | INHALATION_SPRAY | CUTANEOUS | Status: DC | PRN
  Administered 2017-09-03: 1 via TOPICAL
  Filled 2017-09-03: qty 56

## 2017-09-03 MED ORDER — ACETAMINOPHEN 325 MG PO TABS
650.0000 mg | ORAL_TABLET | ORAL | Status: DC | PRN
  Administered 2017-09-04 (×2): 650 mg via ORAL
  Filled 2017-09-03 (×2): qty 2

## 2017-09-03 MED ORDER — COCONUT OIL OIL
1.0000 "application " | TOPICAL_OIL | Status: DC | PRN
  Filled 2017-09-03: qty 120

## 2017-09-03 MED ORDER — PRENATAL MULTIVITAMIN CH: 1.0000 | ORAL_TABLET | Freq: Every day | ORAL | Status: DC

## 2017-09-03 MED ORDER — DIBUCAINE 1 % RE OINT: 1.0000 "application " | TOPICAL_OINTMENT | RECTAL | Status: DC | PRN

## 2017-09-03 MED ORDER — OXYCODONE HCL 5 MG PO TABS
5.0000 mg | ORAL_TABLET | ORAL | Status: DC | PRN
  Administered 2017-09-04 – 2017-09-05 (×2): 5 mg via ORAL
  Filled 2017-09-03 (×3): qty 1

## 2017-09-03 MED ORDER — WITCH HAZEL-GLYCERIN EX PADS: 1.0000 "application " | MEDICATED_PAD | CUTANEOUS | Status: DC | PRN

## 2017-09-03 MED ORDER — PANTOPRAZOLE SODIUM 40 MG PO TBEC
40.0000 mg | DELAYED_RELEASE_TABLET | Freq: Every day | ORAL | Status: DC
  Administered 2017-09-03 – 2017-09-04 (×2): 40 mg via ORAL
  Filled 2017-09-03 (×2): qty 1

## 2017-09-03 MED ORDER — ZOLPIDEM TARTRATE 5 MG PO TABS: 5.0000 mg | ORAL_TABLET | Freq: Every evening | ORAL | Status: DC | PRN

## 2017-09-03 MED ORDER — ONDANSETRON HCL 4 MG PO TABS: 4.0000 mg | ORAL_TABLET | ORAL | Status: DC | PRN

## 2017-09-03 MED ORDER — MINERAL OIL PO OIL
TOPICAL_OIL | Freq: Once | ORAL | Status: DC
  Filled 2017-09-03: qty 30

## 2017-09-03 MED ORDER — LACTATED RINGERS IV SOLN
INTRAVENOUS | Status: DC
  Administered 2017-09-04: via INTRAVENOUS

## 2017-09-03 MED ORDER — METOCLOPRAMIDE HCL 10 MG PO TABS
10.0000 mg | ORAL_TABLET | Freq: Once | ORAL | Status: AC
  Administered 2017-09-04: 10 mg via ORAL
  Filled 2017-09-03: qty 1

## 2017-09-03 MED ORDER — ONDANSETRON HCL 4 MG/2ML IJ SOLN: 4.0000 mg | INTRAMUSCULAR | Status: DC | PRN

## 2017-09-03 MED ORDER — FAMOTIDINE 20 MG PO TABS
40.0000 mg | ORAL_TABLET | Freq: Once | ORAL | Status: AC
  Administered 2017-09-04: 40 mg via ORAL
  Filled 2017-09-03: qty 2

## 2017-09-03 MED ORDER — SENNOSIDES-DOCUSATE SODIUM 8.6-50 MG PO TABS
2.0000 | ORAL_TABLET | ORAL | Status: DC
  Administered 2017-09-04 – 2017-09-05 (×2): 2 via ORAL
  Filled 2017-09-03 (×2): qty 2

## 2017-09-03 MED ORDER — IBUPROFEN 600 MG PO TABS
600.0000 mg | ORAL_TABLET | Freq: Four times a day (QID) | ORAL | Status: DC
  Administered 2017-09-03 – 2017-09-05 (×7): 600 mg via ORAL
  Filled 2017-09-03 (×7): qty 1

## 2017-09-03 MED ORDER — SIMETHICONE 80 MG PO CHEW
80.0000 mg | CHEWABLE_TABLET | ORAL | Status: DC | PRN
  Administered 2017-09-04: 80 mg via ORAL

## 2017-09-03 MED ORDER — TETANUS-DIPHTH-ACELL PERTUSSIS 5-2.5-18.5 LF-MCG/0.5 IM SUSP: 0.5000 mL | Freq: Once | INTRAMUSCULAR | Status: DC

## 2017-09-03 MED ORDER — DIPHENHYDRAMINE HCL 25 MG PO CAPS: 25.0000 mg | ORAL_CAPSULE | Freq: Four times a day (QID) | ORAL | Status: DC | PRN

## 2017-09-03 MED ORDER — LACTATED RINGERS IV SOLN
INTRAVENOUS | Status: DC
  Administered 2017-09-04: 07:00:00 via INTRAVENOUS

## 2017-09-03 NOTE — Progress Notes (Signed)
Heather AcostaDeborah E Christian is a 36 y.o. [id number] at 2476w5d admitted for SOL.  Subjective: Heather Christian is doing well with her epidural in place. She had some intermittent pain on the left side with a cervical check but it has resolved. Otherwise, no complaints. She has felt intermittent pressure and a slight urge to push but it has not been consistent.   Objective: BP 125/65   Pulse (!) 106   Temp 97.8 F (36.6 C) (Oral)   Resp 18   Ht 5\' 4"  (1.626 m)   Wt 222 lb (100.7 kg)   LMP 11/07/2016   SpO2 97%   BMI 38.11 kg/m  No intake/output data recorded. Total I/O In: -  Out: 1100 [Urine:1100]  FHT:  FHR: 150 bpm, variability: minimal ,  accelerations:  Present,  decelerations:  Present early and some variables UC:   regular, every 2 minutes SVE:   Dilation: Lip/rim Effacement (%): 100 Station: 0 Exam by:: Veronica Mensah  Labs: Lab Results  Component Value Date   WBC 11.9 (H) 09/02/2017   HGB 11.5 (L) 09/02/2017   HCT 34.5 (L) 09/02/2017   MCV 83.9 09/02/2017   PLT 276 09/02/2017    Assessment / Plan: Spontaneous labor, progressing normally, expecting labor to be imminent.  Labor: Progressing normally Preeclampsia:  labs stable Fetal Wellbeing:  Category II Pain Control:  Epidural I/D:  GBS neg Anticipated MOD:  NSVD  Anistyn Graddy 09/03/2017, 5:24 AM

## 2017-09-03 NOTE — Progress Notes (Signed)
Pt c/o heartburn and requesting medication. States she takes prilosec twice a day at home and has med from home with her. Instructed pt not to take own med. Dr. Doroteo GlassmanPhelps called and order received.

## 2017-09-03 NOTE — Progress Notes (Signed)
Patient ID: Heather AcostaDeborah E Christian, female   DOB: [birth year], 36 y.o.   MRN: [id number] OB Attending  Pt desires PP BTL but ate @ 7:15 AM. Pt made aware will have to be NPO until this afternoon @ 15:30 for BTL.  Pt desires to have BTL tomorrow instead.  R/B/Post op care and failure rate reviewed with pt. Pt verbalized understanding. Consent obtained. Papers have been signed No prior abd surgery  Will order regular diet , NPO after midnight, IV fluids starting at midnight. House coverage made aware of change of plans and will schedule for PP BTL tomorrow @ 9 AM

## 2017-09-03 NOTE — Progress Notes (Signed)
LEAD explained. Formula sheet given.

## 2017-09-03 NOTE — Progress Notes (Signed)
Heather AcostaDeborah E Christian is a 36 y.o. [id number] at 5278w5d admitted for SOL  Subjective: Heather Christian is sleeping comfortably in bed.  Objective: BP 125/72   Pulse 88   Temp 98.2 F (36.8 C) (Oral)   Resp 18   Ht 5\' 4"  (1.626 m)   Wt 222 lb (100.7 kg)   LMP 11/07/2016   SpO2 98%   BMI 38.11 kg/m  No intake/output data recorded. Total I/O In: -  Out: 100 [Urine:100]  FHT:  FHR: 125 bpm, variability: moderate,  accelerations:  Present,  decelerations:  Absent UC:   irregular, every 2-4 minutes SVE:   Dilation: 5.5 Effacement (%): 60 Station: -2 Exam by:: veronica Mensah  Labs: Lab Results  Component Value Date   WBC 11.9 (H) 09/02/2017   HGB 11.5 (L) 09/02/2017   HCT 34.5 (L) 09/02/2017   MCV 83.9 09/02/2017   PLT 276 09/02/2017    Assessment / Plan: Spontaneous labor, progressing normally  Labor: Progressing on Pitocin, will continue to increase then AROM Preeclampsia:  n/a Fetal Wellbeing:  Category I Pain Control:  Epidural I/D:  GBS neg Anticipated MOD:  NSVD  Juriel Cid 09/03/2017, 1:08 AM

## 2017-09-03 NOTE — Lactation Note (Signed)
This note was copied from a baby's chart. Lactation Consultation Note  Patient Name: Boy Colin InaDeborah Kittleson AVWUJ'WToday's Date: 09/03/2017 Reason for consult: Initial assessment;1st time breastfeeding  Baby 8 hours old. Offered to assist mom with latching the baby, but parents report that baby just finished nursing a little earlier. Family dressing baby. Discussed benefits of STS and enc lifting shirt to offer STS. Mom reports that she came into the hospital planning to breast and formula feed her baby. Mom states that she did not nurse 2 older children, and only wants to nurse for a while to give baby the colostrum. Mom states that she probably will not put baby to right breast since nipple was pierced and "pulled off.'' Mom given LC brochure, aware of OP/BFSG and LC phone line assistance after D/C.   Maternal Data Has patient been taught Hand Expression?: Yes (Per mom.) Does the patient have breastfeeding experience prior to this delivery?: No  Feeding Feeding Type: Breast Fed Length of feed: 12 min  LATCH Score                   Interventions    Lactation Tools Discussed/Used     Consult Status Consult Status: Follow-up Date: 09/04/17 Follow-up type: In-patient    Sherlyn HayJennifer D Deisy Ozbun 09/03/2017, 2:30 PM

## 2017-09-03 NOTE — Progress Notes (Signed)
Patient arrived and I was told patient was getting a BTL and not to eat or drink anything. Patient ate a nibble per her at 715 am. Epidural in place. Patient still needs to be consented orders to be put in and will not be prepped until I get a surgery time.

## 2017-09-03 NOTE — Anesthesia Postprocedure Evaluation (Signed)
Anesthesia Post Note  Patient: Heather AcostaDeborah E Christian  Procedure(s) Performed: AN AD HOC LABOR EPIDURAL     Patient location during evaluation: Mother Baby Anesthesia Type: Epidural Level of consciousness: awake Pain management: pain level controlled Vital Signs Assessment: post-procedure vital signs reviewed and stable Respiratory status: spontaneous breathing Cardiovascular status: stable Postop Assessment: no headache, no backache, epidural receding, patient able to bend at knees, no apparent nausea or vomiting and adequate PO intake Anesthetic complications: no    Last Vitals:  Vitals:   09/03/17 0949 09/03/17 1333  BP: (!) 112/59 (!) 88/60  Pulse: 85 80  Resp: 18 18  Temp: 37.2 C   SpO2:      Last Pain:  Vitals:   09/03/17 1049  TempSrc:   PainSc: 4    Pain Goal: Patients Stated Pain Goal: 8 (09/02/17 1832)               Fanny DanceMULLINS,Sarissa Dern

## 2017-09-04 ENCOUNTER — Encounter (HOSPITAL_COMMUNITY): Payer: Self-pay | Admitting: Anesthesiology

## 2017-09-04 ENCOUNTER — Encounter (HOSPITAL_COMMUNITY)
Admission: AD | Disposition: A | Discharge status: Home / Self Care | Payer: Self-pay | Source: Ambulatory Visit | Attending: Obstetrics and Gynecology

## 2017-09-04 ENCOUNTER — Inpatient Hospital Stay (HOSPITAL_COMMUNITY): Payer: Medicaid Other | Admitting: Anesthesiology

## 2017-09-04 ENCOUNTER — Inpatient Hospital Stay (HOSPITAL_COMMUNITY): Admission: RE | Admit: 2017-09-04 | Payer: Medicaid Other | Source: Ambulatory Visit

## 2017-09-04 ENCOUNTER — Other Ambulatory Visit: Payer: Self-pay

## 2017-09-04 DIAGNOSIS — Z302 Encounter for sterilization: Secondary | ICD-10-CM

## 2017-09-04 HISTORY — PX: TUBAL LIGATION: SHX77

## 2017-09-04 LAB — CBC
HEMATOCRIT: 33.7 % — AB (ref 36.0–46.0)
HEMOGLOBIN: 10.9 g/dL — AB (ref 12.0–15.0)
MCH: 27.7 pg (ref 26.0–34.0)
MCHC: 32.3 g/dL (ref 30.0–36.0)
MCV: 85.8 fL (ref 78.0–100.0)
Platelets: 270 10*3/uL (ref 150–400)
RBC: 3.93 MIL/uL (ref 3.87–5.11)
RDW: 15.2 % (ref 11.5–15.5)
WBC: 15.2 10*3/uL — AB (ref 4.0–10.5)

## 2017-09-04 SURGERY — LIGATION, FALLOPIAN TUBE, POSTPARTUM
Anesthesia: Spinal | Laterality: Bilateral | Wound class: Clean Contaminated

## 2017-09-04 MED ORDER — BUPIVACAINE IN DEXTROSE 0.75-8.25 % IT SOLN
INTRATHECAL | Status: DC | PRN
  Administered 2017-09-04: 1.6 mL via INTRATHECAL

## 2017-09-04 MED ORDER — SODIUM BICARBONATE 8.4 % IV SOLN
INTRAVENOUS | Status: AC
  Filled 2017-09-04: qty 50

## 2017-09-04 MED ORDER — MIDAZOLAM HCL 2 MG/2ML IJ SOLN
INTRAMUSCULAR | Status: AC
  Filled 2017-09-04: qty 2

## 2017-09-04 MED ORDER — FENTANYL CITRATE (PF) 100 MCG/2ML IJ SOLN
INTRAMUSCULAR | Status: AC
  Filled 2017-09-04: qty 2

## 2017-09-04 MED ORDER — PHENYLEPHRINE HCL 10 MG/ML IJ SOLN
INTRAMUSCULAR | Status: DC | PRN
  Administered 2017-09-04: 40 ug via INTRAVENOUS

## 2017-09-04 MED ORDER — OXYCODONE HCL 5 MG/5ML PO SOLN: 5.0000 mg | Freq: Once | ORAL | Status: AC | PRN

## 2017-09-04 MED ORDER — MIDAZOLAM HCL 2 MG/2ML IJ SOLN
INTRAMUSCULAR | Status: DC | PRN
  Administered 2017-09-04 (×2): 1 mg via INTRAVENOUS

## 2017-09-04 MED ORDER — LACTATED RINGERS IV SOLN
INTRAVENOUS | Status: DC | PRN
  Administered 2017-09-04: 09:00:00 via INTRAVENOUS

## 2017-09-04 MED ORDER — LIDOCAINE-EPINEPHRINE (PF) 2 %-1:200000 IJ SOLN
INTRAMUSCULAR | Status: AC
  Filled 2017-09-04: qty 20

## 2017-09-04 MED ORDER — KETOROLAC TROMETHAMINE 30 MG/ML IJ SOLN
INTRAMUSCULAR | Status: DC | PRN
  Administered 2017-09-04: 30 mg via INTRAVENOUS

## 2017-09-04 MED ORDER — FENTANYL CITRATE (PF) 100 MCG/2ML IJ SOLN
INTRAMUSCULAR | Status: DC | PRN
  Administered 2017-09-04: 50 ug via INTRAVENOUS

## 2017-09-04 MED ORDER — BUPIVACAINE HCL (PF) 0.25 % IJ SOLN
INTRAMUSCULAR | Status: DC | PRN
  Administered 2017-09-04: 10 mL

## 2017-09-04 MED ORDER — LIDOCAINE HCL 1 % IJ SOLN
INTRAMUSCULAR | Status: AC
  Filled 2017-09-04: qty 20

## 2017-09-04 MED ORDER — PHENYLEPHRINE 40 MCG/ML (10ML) SYRINGE FOR IV PUSH (FOR BLOOD PRESSURE SUPPORT)
PREFILLED_SYRINGE | INTRAVENOUS | Status: AC
  Filled 2017-09-04: qty 10

## 2017-09-04 MED ORDER — OXYCODONE HCL 5 MG PO TABS
5.0000 mg | ORAL_TABLET | Freq: Once | ORAL | Status: AC | PRN
  Administered 2017-09-04: 5 mg via ORAL

## 2017-09-04 MED ORDER — BUPIVACAINE HCL (PF) 0.25 % IJ SOLN
INTRAMUSCULAR | Status: AC
  Filled 2017-09-04: qty 10

## 2017-09-04 MED ORDER — HYDROMORPHONE HCL 1 MG/ML IJ SOLN: 0.2500 mg | INTRAMUSCULAR | Status: DC | PRN

## 2017-09-04 MED ORDER — BUPIVACAINE IN DEXTROSE 0.75-8.25 % IT SOLN
INTRATHECAL | Status: AC
  Filled 2017-09-04: qty 2

## 2017-09-04 MED ORDER — PROMETHAZINE HCL 25 MG/ML IJ SOLN: 6.2500 mg | INTRAMUSCULAR | Status: DC | PRN

## 2017-09-04 MED ORDER — ONDANSETRON HCL 4 MG/2ML IJ SOLN
INTRAMUSCULAR | Status: DC | PRN
  Administered 2017-09-04: 4 mg via INTRAVENOUS

## 2017-09-04 MED ORDER — BUPIVACAINE HCL (PF) 0.25 % IJ SOLN
INTRAMUSCULAR | Status: AC
  Filled 2017-09-04: qty 20

## 2017-09-04 SURGICAL SUPPLY — 20 items
CLIP FILSHIE TUBAL LIGA STRL (Clip) ×3 IMPLANT
CLOTH BEACON ORANGE TIMEOUT ST (SAFETY) ×3 IMPLANT
DRSG OPSITE POSTOP 3X4 (GAUZE/BANDAGES/DRESSINGS) ×3 IMPLANT
DURAPREP 26ML APPLICATOR (WOUND CARE) ×3 IMPLANT
GLOVE BIOGEL PI IND STRL 7.0 (GLOVE) ×2 IMPLANT
GLOVE BIOGEL PI INDICATOR 7.0 (GLOVE) ×4
GLOVE ECLIPSE 7.0 STRL STRAW (GLOVE) ×6 IMPLANT
GOWN STRL REUS W/TWL LRG LVL3 (GOWN DISPOSABLE) ×6 IMPLANT
NEEDLE HYPO 22GX1.5 SAFETY (NEEDLE) ×3 IMPLANT
NS IRRIG 1000ML POUR BTL (IV SOLUTION) ×3 IMPLANT
PACK ABDOMINAL MINOR (CUSTOM PROCEDURE TRAY) ×3 IMPLANT
PROTECTOR NERVE ULNAR (MISCELLANEOUS) ×3 IMPLANT
SPONGE LAP 4X18 X RAY DECT (DISPOSABLE) IMPLANT
SUT VIC AB 0 CT1 27 (SUTURE) ×2
SUT VIC AB 0 CT1 27XBRD ANBCTR (SUTURE) ×1 IMPLANT
SUT VICRYL 4-0 PS2 18IN ABS (SUTURE) ×3 IMPLANT
SYR CONTROL 10ML LL (SYRINGE) ×3 IMPLANT
TOWEL OR 17X24 6PK STRL BLUE (TOWEL DISPOSABLE) ×6 IMPLANT
TRAY FOLEY CATH SILVER 14FR (SET/KITS/TRAYS/PACK) ×3 IMPLANT
WATER STERILE IRR 1000ML POUR (IV SOLUTION) ×3 IMPLANT

## 2017-09-04 NOTE — Progress Notes (Signed)
Post Partum Day 1 Subjective: no complaints, up ad lib, voiding and tolerating PO  Objective: Blood pressure 109/64, pulse 67, temperature 97.9 F (36.6 C), temperature source Oral, resp. rate 20, height 5\' 4"  (1.626 m), weight 222 lb (100.7 kg), last menstrual period 11/07/2016, SpO2 97 %, unknown if currently breastfeeding.  Physical Exam:  General: alert, cooperative and appears stated age Lochia: appropriate Uterine Fundus: firm DVT Evaluation: No evidence of DVT seen on physical exam.  Recent Labs    09/02/17 2000 09/04/17 0504  HGB 11.5* 10.9*  HCT 34.5* 33.7*    Assessment/Plan: Plan for discharge tomorrow and Contraception ppBTL Routine pp care   LOS: 2 days   Heather Christian 09/04/2017, 9:14 AM

## 2017-09-04 NOTE — Op Note (Signed)
Heather AcostaDeborah E Guynes  09/04/2017  PREOPERATIVE DIAGNOSIS:  Multiparity, undesired fertility  POSTOPERATIVE DIAGNOSIS:  Multiparity, undesired fertility  PROCEDURE:  Postpartum Bilateral Tubal Sterilization using Filshie Clips   ANESTHESIA:  Epidural and local analgesia using 0.25% Marcaine  COMPLICATIONS:  None immediate.  ESTIMATED BLOOD LOSS: 5 ml.  INDICATIONS: 36 y.o. [id number]  with undesired fertility,status post vaginal delivery, desires permanent sterilization.  Other reversible forms of contraception were discussed with patient; she declines all other modalities. Risks of procedure discussed with patient including but not limited to: risk of regret, permanence of method, bleeding, infection, injury to surrounding organs and need for additional procedures.  Failure risk of 0.5-1% with increased risk of ectopic gestation if pregnancy occurs was also discussed with patient.     FINDINGS:  Normal uterus, tubes, and ovaries.  PROCEDURE DETAILS: The patient was taken to the operating room where her spinal anesthesia was dosed up to surgical level and found to be adequate.  She was then placed in a supine position and prepped and draped in the usual sterile fashion.  After an adequate timeout was performed, attention was turned to the patient's abdomen where a small transverse skin incision was made under the umbilical fold. The incision was taken down to the layer of fascia using the scalpel, and fascia was incised, and extended bilaterally. The peritoneum was entered in a sharp fashion. The patient was placed in Trendelenburg.  A moist lap pad was used to move omentum and bowel away until the left fallopian tube was identified and grasped with a Babcock clamp, and followed out to the fimbriated end.  A Filshie clip was placed on the left fallopian tube about 2 cm from the cornu.  A similar process was carried out on the right side allowing for bilateral tubal sterilization.  Good hemostasis was  noted overall.  Local analgesia was injected into both Filshie application sites.The instruments were then removed from the patient's abdomen and the fascial incision was repaired with 0 Vicryl, and the skin was closed with a 4-0 Vicryl subcuticular stitch. The patient tolerated the procedure well.  Sponge, lap, and needle counts were correct times two.  The patient was then taken to the recovery room awake in stable condition.  Reva Boresanya S Lene Mckay MD 09/04/2017 10:03 AM

## 2017-09-04 NOTE — Anesthesia Postprocedure Evaluation (Signed)
Anesthesia Post Note  Patient: Heather AcostaDeborah E Christian  Procedure(s) Performed: POST PARTUM TUBAL LIGATION (Bilateral )     Patient location during evaluation: Mother Baby Anesthesia Type: Spinal Level of consciousness: awake Pain management: pain level controlled Vital Signs Assessment: post-procedure vital signs reviewed and stable Respiratory status: spontaneous breathing Cardiovascular status: stable Postop Assessment: no headache, no backache, spinal receding, patient able to bend at knees, no apparent nausea or vomiting and adequate PO intake Anesthetic complications: no    Last Vitals:  Vitals:   09/04/17 1247 09/04/17 1347  BP:  112/68  Pulse:  85  Resp:  18  Temp: 36.8 C 36.8 C  SpO2:      Last Pain:  Vitals:   09/04/17 1349  TempSrc:   PainSc: 3    Pain Goal: Patients Stated Pain Goal: 2 (09/03/17 2117)               Fanny DanceMULLINS,[name]

## 2017-09-04 NOTE — Progress Notes (Signed)
Labor Progress Note Heather AcostaDeborah E Christian is a 36 y.o. [id number] at 4648w5d Admitted for SOL. Pt delivered a baby boy via SVD at 0630 yesterady.  S: Pt is resting comfortably in bed. Denies headache, blurry vision, or RUQ pain. NPO due to scheduled BTL this AM.   O:  BP 109/64 (BP Location: Right Arm)   Pulse 67   Temp 97.9 F (36.6 C) (Oral)   Resp 20   Ht 5\' 4"  (1.626 m)   Wt 100.7 kg (222 lb)   LMP 11/07/2016   SpO2 97%   Bottle-feeding, BTL   BMI 38.11 kg/m    CVE: Dilation: 10 Dilation Complete Date: 09/03/17 Dilation Complete Time: 0555 Effacement (%): 100 Cervical Position: Anterior Station: +1 Presentation: Vertex Exam by:: Dennis BastVeronica Mensah   A&P: 36 y.o. [id number] 7748w5d admitted for SOL. PT is PPD#1 after SVD at 0630 yesterday. Labs nml, vitals stable. Plan to bottle-feed and outpatient circumcision. Pt requested for BTL. Has papers signed and surgery scheduled at 0900. Currently NPO.  #Pain: None reported #GBS negative  Steffanie RainwaterProsper M Shariece Viveiros, Medical Student 7:21 AM

## 2017-09-04 NOTE — Plan of Care (Signed)
Assessment WNL, epidural and NSL in place. Pt aware of NPO status in preparation for BTL tomorrow.

## 2017-09-04 NOTE — Addendum Note (Signed)
Addendum  created 09/04/17 1835 by Angela AdamWrinkle, Babak Lucus G, CRNA   Intraprocedure Event deleted, Intraprocedure Event edited

## 2017-09-04 NOTE — Addendum Note (Signed)
Addendum  created 09/04/17 1516 by Renford DillsMullins, Mylisa Brunson L, CRNA   Sign clinical note

## 2017-09-04 NOTE — Anesthesia Preprocedure Evaluation (Signed)
Anesthesia Evaluation  Patient identified by MRN, date of birth, ID band Patient awake    Reviewed: Allergy & Precautions, NPO status , Patient's Chart, lab work & pertinent test results  Airway Mallampati: II  TM Distance: >3 FB Neck ROM: Full    Dental no notable dental hx.    Pulmonary neg pulmonary ROS, former smoker,    Pulmonary exam normal breath sounds clear to auscultation       Cardiovascular negative cardio ROS Normal cardiovascular exam Rhythm:Regular Rate:Normal     Neuro/Psych negative neurological ROS  negative psych ROS   GI/Hepatic negative GI ROS, Neg liver ROS,   Endo/Other  negative endocrine ROS  Renal/GU negative Renal ROS  negative genitourinary   Musculoskeletal negative musculoskeletal ROS (+)   Abdominal   Peds negative pediatric ROS (+)  Hematology negative hematology ROS (+)   Anesthesia Other Findings   Reproductive/Obstetrics negative OB ROS (+) Pregnancy                             Anesthesia Physical  Anesthesia Plan  ASA: II  Anesthesia Plan: Spinal   Post-op Pain Management:    Induction:   PONV Risk Score and Plan: 2 and Ondansetron, Midazolam and Treatment may vary due to age or medical condition  Airway Management Planned: Simple Face Mask  Additional Equipment:   Intra-op Plan:   Post-operative Plan:   Informed Consent: I have reviewed the patients History and Physical, chart, labs and discussed the procedure including the risks, benefits and alternatives for the proposed anesthesia with the patient or authorized representative who has indicated his/her understanding and acceptance.   Dental advisory given  Plan Discussed with:   Anesthesia Plan Comments:         Anesthesia Quick Evaluation

## 2017-09-04 NOTE — Anesthesia Postprocedure Evaluation (Signed)
Anesthesia Post Note  Patient: Heather AcostaDeborah E Christian  Procedure(s) Performed: POST PARTUM TUBAL LIGATION (Bilateral )     Patient location during evaluation: PACU Anesthesia Type: Spinal Level of consciousness: oriented and awake and alert Pain management: pain level controlled Vital Signs Assessment: post-procedure vital signs reviewed and stable Respiratory status: spontaneous breathing and respiratory function stable Cardiovascular status: blood pressure returned to baseline and stable Postop Assessment: no headache, no backache and no apparent nausea or vomiting Anesthetic complications: no    Last Vitals:  Vitals:   09/04/17 1130 09/04/17 1135  BP: 107/68   Pulse: 60 (!) 50  Resp: 14 19  Temp: 36.6 C   SpO2: 98% 99%    Last Pain:  Vitals:   09/04/17 1130  TempSrc: Oral  PainSc: 5    Pain Goal: Patients Stated Pain Goal: 2 (09/03/17 2117)               Lowella CurbWarren Ray Janica Eldred

## 2017-09-04 NOTE — Anesthesia Procedure Notes (Signed)
Spinal  Patient location during procedure: OB Start time: 09/04/2017 9:21 AM End time: 09/04/2017 9:26 AM Staffing Anesthesiologist: Lowella CurbMiller, Alyssa Rotondo Ray, MD Performed: anesthesiologist  Preanesthetic Checklist Completed: patient identified, surgical consent, pre-op evaluation, timeout performed, IV checked, risks and benefits discussed and monitors and equipment checked Spinal Block Patient position: sitting Prep: site prepped and draped and DuraPrep Patient monitoring: heart rate, cardiac monitor, continuous pulse ox and blood pressure Approach: midline Location: L3-4 Injection technique: single-shot Needle Needle type: Pencan  Needle gauge: 24 G Needle length: 10 cm Assessment Sensory level: T4

## 2017-09-04 NOTE — Interval H&P Note (Signed)
History and Physical Interval Note:  09/04/2017 9:03 AM  Heather Christian  has presented today for surgery, with the diagnosis of Desires Sterilization now s/p SVD. The various methods of treatment have been discussed with the patient and family. After consideration of risks, benefits and other options for treatment, the patient has consented to  Procedure(s): POST PARTUM TUBAL LIGATION (Bilateral) as a surgical intervention .  The patient's history has been reviewed, patient examined, no change in status, stable for surgery.  I have reviewed the patient's chart and labs.  Questions were answered to the patient's satisfaction.     Reva Boresanya S Alister Staver

## 2017-09-04 NOTE — Transfer of Care (Signed)
Immediate Anesthesia Transfer of Care Note  Patient: Johnna AcostaDeborah E Klingler  Procedure(s) Performed: POST PARTUM TUBAL LIGATION (Bilateral )  Patient Location: PACU  Anesthesia Type:Spinal  Level of Consciousness: awake and alert   Airway & Oxygen Therapy: Patient Spontanous Breathing  Post-op Assessment: Report given to RN and Post -op Vital signs reviewed and stable  Post vital signs: Reviewed  Last Vitals:  Vitals:   09/03/17 2117 09/04/17 0700  BP: (!) 117/53 109/64  Pulse: 79 67  Resp: 20 20  Temp: 36.6 C 36.6 C  SpO2:      Last Pain:  Vitals:   09/04/17 0700  TempSrc: Oral  PainSc:       Patients Stated Pain Goal: 2 (09/03/17 2117)  Complications: No apparent anesthesia complications

## 2017-09-05 ENCOUNTER — Encounter (HOSPITAL_COMMUNITY): Payer: Self-pay

## 2017-09-05 MED ORDER — IBUPROFEN 600 MG PO TABS: 600.0000 mg | ORAL_TABLET | Freq: Four times a day (QID) | ORAL | 0 refills | Status: DC | PRN

## 2017-09-05 MED ORDER — OXYCODONE HCL 5 MG PO TABS: 5.0000 mg | ORAL_TABLET | ORAL | 0 refills | Status: DC | PRN

## 2017-09-05 NOTE — Discharge Instructions (Signed)
Laparoscopic Tubal Ligation, Care After °Refer to this sheet in the next few weeks. These instructions provide you with information about caring for yourself after your procedure. Your health care provider may also give you more specific instructions. Your treatment has been planned according to current medical practices, but problems sometimes occur. Call your health care provider if you have any problems or questions after your procedure. °What can I expect after the procedure? °After the procedure, it is common to have: °· A sore throat. °· Discomfort in your shoulder. °· Mild discomfort or cramping in your abdomen. °· Gas pains. °· Pain or soreness in the area where the surgical cut (incision) was made. °· A bloated feeling. °· Tiredness. °· Nausea. °· Vomiting. ° °Follow these instructions at home: °Medicines °· Take over-the-counter and prescription medicines only as told by your health care provider. °· Do not take aspirin because it can cause bleeding. °· Do not drive or operate heavy machinery while taking prescription pain medicine. °Activity °· Rest for the rest of the day. °· Return to your normal activities as told by your health care provider. Ask your health care provider what activities are safe for you. °Incision care ° °· Follow instructions from your health care provider about how to take care of your incision. Make sure you: °? Wash your hands with soap and water before you change your bandage (dressing). If soap and water are not available, use hand sanitizer. °? Change your dressing as told by your health care provider. °? Leave stitches (sutures) in place. They may need to stay in place for 2 weeks or longer. °· Check your incision area every day for signs of infection. Check for: °? More redness, swelling, or pain. °? More fluid or blood. °? Warmth. °? Pus or a bad smell. °Other Instructions °· Do not take baths, swim, or use a hot tub until your health care provider approves. You may take  showers. °· Keep all follow-up visits as told by your health care provider. This is important. °· Have someone help you with your daily household tasks for the first few days. °Contact a health care provider if: °· You have more redness, swelling, or pain around your incision. °· Your incision feels warm to the touch. °· You have pus or a bad smell coming from your incision. °· The edges of your incision break open after the sutures have been removed. °· Your pain does not improve after 2-3 days. °· You have a rash. °· You repeatedly become dizzy or light-headed. °· Your pain medicine is not helping. °· You are constipated. °Get help right away if: °· You have a fever. °· You faint. °· You have increasing pain in your abdomen. °· You have severe pain in one or both of your shoulders. °· You have fluid or blood coming from your sutures or from your vagina. °· You have shortness of breath or difficulty breathing. °· You have chest pain or leg pain. °· You have ongoing nausea, vomiting, or diarrhea. °This information is not intended to replace advice given to you by your health care provider. Make sure you discuss any questions you have with your health care provider. °Document Released: 05/07/2005 Document Revised: 03/22/2016 Document Reviewed: 09/28/2015 °Elsevier Interactive Patient Education © 2018 Elsevier Inc[employer] °Vaginal Delivery, Care After °Refer to this sheet in the next few weeks. These instructions provide you with information about caring for yourself after vaginal delivery. Your health care provider may also give you more   specific instructions. Your treatment has been planned according to current medical practices, but problems sometimes occur. Call your health care provider if you have any problems or questions. What can I expect after the procedure? After vaginal delivery, it is common to have:  Some bleeding from your vagina.  Soreness in your abdomen, your vagina, and the area of skin between your  vaginal opening and your anus (perineum).  Pelvic cramps.  Fatigue.  Follow these instructions at home: Medicines  Take over-the-counter and prescription medicines only as told by your health care provider.  If you were prescribed an antibiotic medicine, take it as told by your health care provider. Do not stop taking the antibiotic until it is finished. Driving   Do not drive or operate heavy machinery while taking prescription pain medicine.  Do not drive for 24 hours if you received a sedative. Lifestyle  Do not drink alcohol. This is especially important if you are breastfeeding or taking medicine to relieve pain.  Do not use tobacco products, including cigarettes, chewing tobacco, or e-cigarettes. If you need help quitting, ask your health care provider. Eating and drinking  Drink at least 8 eight-ounce glasses of water every day unless you are told not to by your health care provider. If you choose to breastfeed your baby, you may need to drink more water than this.  Eat high-fiber foods every day. These foods may help prevent or relieve constipation. High-fiber foods include: ? Whole grain cereals and breads. ? Brown rice. ? Beans. ? Fresh fruits and vegetables. Activity  Return to your normal activities as told by your health care provider. Ask your health care provider what activities are safe for you.  Rest as much as possible. Try to rest or take a nap when your baby is sleeping.  Do not lift anything that is heavier than your baby or 10 lb (4.5 kg) until your health care provider says that it is safe.  Talk with your health care provider about when you can engage in sexual activity. This may depend on your: ? Risk of infection. ? Rate of healing. ? Comfort and desire to engage in sexual activity. Vaginal Care  If you have an episiotomy or a vaginal tear, check the area every day for signs of infection. Check for: ? More redness, swelling, or pain. ? More  fluid or blood. ? Warmth. ? Pus or a bad smell.  Do not use tampons or douches until your health care provider says this is safe.  Watch for any blood clots that may pass from your vagina. These may look like clumps of dark red, brown, or black discharge. General instructions  Keep your perineum clean and dry as told by your health care provider.  Wear loose, comfortable clothing.  Wipe from front to back when you use the toilet.  Ask your health care provider if you can shower or take a bath. If you had an episiotomy or a perineal tear during labor and delivery, your health care provider may tell you not to take baths for a certain length of time.  Wear a bra that supports your breasts and fits you well.  If possible, have someone help you with household activities and help care for your baby for at least a few days after you leave the hospital.  Keep all follow-up visits for you and your baby as told by your health care provider. This is important. Contact a health care provider if:  You have: ?  Vaginal discharge that has a bad smell. ? Difficulty urinating. ? Pain when urinating. ? A sudden increase or decrease in the frequency of your bowel movements. ? More redness, swelling, or pain around your episiotomy or vaginal tear. ? More fluid or blood coming from your episiotomy or vaginal tear. ? Pus or a bad smell coming from your episiotomy or vaginal tear. ? A fever. ? A rash. ? Little or no interest in activities you used to enjoy. ? Questions about caring for yourself or your baby.  Your episiotomy or vaginal tear feels warm to the touch.  Your episiotomy or vaginal tear is separating or does not appear to be healing.  Your breasts are painful, hard, or turn red.  You feel unusually sad or worried.  You feel nauseous or you vomit.  You pass large blood clots from your vagina. If you pass a blood clot from your vagina, save it to show to your health care provider. Do  not flush blood clots down the toilet without having your health care provider look at them.  You urinate more than usual.  You are dizzy or light-headed.  You have not breastfed at all and you have not had a menstrual period for 12 weeks after delivery.  You have stopped breastfeeding and you have not had a menstrual period for 12 weeks after you stopped breastfeeding. Get help right away if:  You have: ? Pain that does not go away or does not get better with medicine. ? Chest pain. ? Difficulty breathing. ? Blurred vision or spots in your vision. ? Thoughts about hurting yourself or your baby.  You develop pain in your abdomen or in one of your legs.  You develop a severe headache.  You faint.  You bleed from your vagina so much that you fill two sanitary pads in one hour. This information is not intended to replace advice given to you by your health care provider. Make sure you discuss any questions you have with your health care provider. Document Released: 10/15/2000 Document Revised: 03/31/2016 Document Reviewed: 11/02/2015 Elsevier Interactive Patient Education  2017 [employer]

## 2017-09-05 NOTE — Discharge Summary (Signed)
OB Discharge Summary     Patient Name: Heather Christian DOB: Nov 16, 1980 MRN: [id number]  Date of admission: 09/02/2017 Delivering MD: Arlyce HarmanLOCKAMY, TIMOTHY   Date of discharge: 09/05/2017  Admitting diagnosis: 39WKS CTX 5CM TODAY IN OFFICE ELEVATED BP Desires Sterilization Intrauterine pregnancy: 5917w5d     Secondary diagnosis:  Active Problems:   Labor and delivery, indication for care   Encounter for sterilization  Additional problems: AMA; hx cx dysplasia/LEEP     Discharge diagnosis: Term Pregnancy Delivered                                                                                                Post partum procedures:postpartum tubal ligation  Augmentation: AROM  Complications: None  Hospital course:  Onset of Labor With Vaginal Delivery     36 y.o. yo [id number] at 1717w5d was admitted in Latent Labor on 09/02/2017. Patient had an uncomplicated labor course as follows:  Membrane Rupture Time/Date: 1:51 AM ,09/03/2017   Intrapartum Procedures: Episiotomy: None [1]                                         Lacerations:  None [1]  Patient had a delivery of a Viable infant. 09/03/2017  Information for the patient's newborn:  Heather Christian [[id number]][[id number]]  Delivery Method: Vag-Spont    Pateint had an uncomplicated postpartum course. On PPD#1 she underwent a ppBTL and did well. She is ambulating, tolerating a regular diet, passing flatus, and urinating well. Patient is discharged home in stable condition on 09/05/17.   Physical exam  Vitals:   09/04/17 1715 09/04/17 2100 09/05/17 0030 09/05/17 0530  BP: 99/69 (!) 105/44 (!) 98/50 (!) 97/42  Pulse:  72 76 69  Resp: 18 19 18 20   Temp: 98.2 F (36.8 C) 97.7 F (36.5 C) 98.3 F (36.8 C) 98 F (36.7 C)  TempSrc:   Oral   SpO2: 99% 96% 97% 96%  Weight:      Height:       General: alert and cooperative Lochia: appropriate Uterine Fundus: firm Incision: umbilical incision C, D, I DVT Evaluation: No evidence of DVT  seen on physical exam. Labs: Lab Results  Component Value Date   WBC 15.2 (H) 09/04/2017   HGB 10.9 (L) 09/04/2017   HCT 33.7 (L) 09/04/2017   MCV 85.8 09/04/2017   PLT 270 09/04/2017   CMP Latest Ref Rng & Units 02/25/2017  Glucose 65 - 99 mg/dL 88  BUN 6 - 20 mg/dL 6  Creatinine 7.250.57 - 3.661.00 mg/dL 4.40(H0.53(L)  Sodium 474134 - 259144 mmol/L 139  Potassium 3.5 - 5.2 mmol/L 3.5  Chloride 96 - 106 mmol/L 102  CO2 18 - 29 mmol/L 21  Calcium 8.7 - 10.2 mg/dL 9.0  Total Protein 6.0 - 8.5 g/dL 6.3  Total Bilirubin 0.0 - 1.2 mg/dL <5.6<0.2  Alkaline Phos 39 - 117 IU/L 60  AST 0 - 40 IU/L 12  ALT 0 - 32 IU/L 14    Discharge instruction:  per After Visit Summary and "Baby and Me Booklet".  After visit meds:  Allergies as of 09/05/2017      Reactions   Azithromycin Other (See Comments)   Reaction:  Abdominal pain    Erythromycin Other (See Comments)   Reaction:  Abdominal pain       Medication List    STOP taking these medications   acetaminophen 325 MG tablet Commonly known as:  TYLENOL   aspirin EC 81 MG tablet   calcium carbonate 500 MG chewable tablet Commonly known as:  TUMS - dosed in mg elemental calcium   omeprazole 20 MG capsule Commonly known as:  PRILOSEC     TAKE these medications   cetirizine 10 MG tablet Commonly known as:  ZYRTEC Take 10 mg by mouth daily as needed for allergies.   fluticasone 50 MCG/ACT nasal spray Commonly known as:  FLONASE Place 1-2 sprays into both nostrils daily as needed for rhinitis.   hydrocortisone 25 MG suppository Commonly known as:  ANUSOL-HC Place 1 suppository (25 mg total) rectally 2 (two) times daily as needed for hemorrhoids or anal itching.   ibuprofen 600 MG tablet Commonly known as:  ADVIL,MOTRIN Take 1 tablet (600 mg total) every 6 (six) hours as needed by mouth.   oxyCODONE 5 MG immediate release tablet Commonly known as:  Oxy IR/ROXICODONE Take 1 tablet (5 mg total) every 4 (four) hours as needed by mouth (pain scale  4-7).   phenylephrine-shark liver oil-mineral oil-petrolatum 0.25-3-14-71.9 % rectal ointment Commonly known as:  PREPARATION H Place 1 application rectally 3 (three) times daily as needed for hemorrhoids.   prenatal multivitamin Tabs tablet Take 1 tablet by mouth daily.       Diet: routine diet  Activity: Advance as tolerated. Pelvic rest for 6 weeks.   Outpatient follow up:4 weeks Follow up Appt: Future Appointments  Date Time Provider Department Center  09/30/2017 11:15 AM Anyanwu, Jethro Bastos, MD [facility]   Follow up Visit:No Follow-up on file.  Postpartum contraception: Tubal Ligation s/p  Newborn Data: Live born female  Birth Weight: 7 lb 2.8 oz (3255 g) APGAR: 9, 9  Newborn Delivery   Birth date/time:  09/03/2017 06:14:00 Delivery type:  Vaginal, Spontaneous     Baby Feeding: Bottle Disposition:home with mother   09/05/2017 Heather Christian, CNM 8:03 AM

## 2017-09-05 NOTE — Plan of Care (Signed)
Pt. Has had 1st bm after or

## 2017-09-06 ENCOUNTER — Encounter (HOSPITAL_COMMUNITY): Payer: Self-pay | Admitting: Family Medicine

## 2017-09-09 ENCOUNTER — Telehealth: Payer: Self-pay

## 2017-09-09 NOTE — Telephone Encounter (Signed)
Patient called stating she noticed knots in her legs for the past 2 days and her vaginal bleeding smell different then what she can remember. I have advised patient to follow up in the office or go to MAU to be exam regarding knots on legs. Patient voice understanding at this time.

## 2017-09-10 ENCOUNTER — Inpatient Hospital Stay (HOSPITAL_COMMUNITY)
Admission: AD | Admit: 2017-09-10 | Discharge: 2017-09-10 | Disposition: A | Discharge status: Home / Self Care | Payer: Medicaid Other | Source: Ambulatory Visit | Attending: Obstetrics & Gynecology | Admitting: Obstetrics & Gynecology

## 2017-09-10 ENCOUNTER — Encounter (HOSPITAL_COMMUNITY): Payer: Self-pay

## 2017-09-10 ENCOUNTER — Inpatient Hospital Stay (HOSPITAL_BASED_OUTPATIENT_CLINIC_OR_DEPARTMENT_OTHER)
Admit: 2017-09-10 | Discharge: 2017-09-10 | Disposition: A | Discharge status: Home / Self Care | Payer: Medicaid Other | Attending: Obstetrics and Gynecology | Admitting: Obstetrics and Gynecology

## 2017-09-10 DIAGNOSIS — M79661 Pain in right lower leg: Secondary | ICD-10-CM

## 2017-09-10 DIAGNOSIS — N760 Acute vaginitis: Secondary | ICD-10-CM

## 2017-09-10 DIAGNOSIS — M7989 Other specified soft tissue disorders: Secondary | ICD-10-CM | POA: Diagnosis not present

## 2017-09-10 DIAGNOSIS — O9089 Other complications of the puerperium, not elsewhere classified: Secondary | ICD-10-CM | POA: Insufficient documentation

## 2017-09-10 DIAGNOSIS — B9689 Other specified bacterial agents as the cause of diseases classified elsewhere: Secondary | ICD-10-CM

## 2017-09-10 DIAGNOSIS — M25461 Effusion, right knee: Secondary | ICD-10-CM | POA: Diagnosis not present

## 2017-09-10 DIAGNOSIS — Z87891 Personal history of nicotine dependence: Secondary | ICD-10-CM | POA: Insufficient documentation

## 2017-09-10 DIAGNOSIS — N898 Other specified noninflammatory disorders of vagina: Secondary | ICD-10-CM

## 2017-09-10 DIAGNOSIS — I82811 Embolism and thrombosis of superficial veins of right lower extremities: Secondary | ICD-10-CM

## 2017-09-10 LAB — URINALYSIS, ROUTINE W REFLEX MICROSCOPIC
Bilirubin Urine: NEGATIVE
Glucose, UA: NEGATIVE mg/dL
Hgb urine dipstick: NEGATIVE
Ketones, ur: NEGATIVE mg/dL
LEUKOCYTES UA: NEGATIVE
NITRITE: NEGATIVE
Protein, ur: NEGATIVE mg/dL
Specific Gravity, Urine: 1.019 (ref 1.005–1.030)
pH: 5 (ref 5.0–8.0)

## 2017-09-10 LAB — WET PREP, GENITAL
SPERM: NONE SEEN
Trich, Wet Prep: NONE SEEN
YEAST WET PREP: NONE SEEN

## 2017-09-10 MED ORDER — METRONIDAZOLE 500 MG PO TABS: 500.0000 mg | ORAL_TABLET | Freq: Two times a day (BID) | ORAL | 0 refills | Status: DC

## 2017-09-10 NOTE — Discharge Instructions (Signed)
Elevate legs Apply heart to area Motrin 600 q 6 hrs Notify physician if any chest pain. Bacterial Vaginosis Bacterial vaginosis is an infection of the vagina. It happens when too many germs (bacteria) grow in the vagina. This infection puts you at risk for infections from sex (STIs). Treating this infection can lower your risk for some STIs. You should also treat this if you are pregnant. It can cause your baby to be born early. Follow these instructions at home: Medicines  Take over-the-counter and prescription medicines only as told by your doctor.  Take or use your antibiotic medicine as told by your doctor. Do not stop taking or using it even if you start to feel better. General instructions  If you your sexual partner is a woman, tell her that you have this infection. She needs to get treatment if she has symptoms. If you have a female partner, he does not need to be treated.  During treatment: ? Avoid sex. ? Do not douche. ? Avoid alcohol as told. ? Avoid breastfeeding as told.  Drink enough fluid to keep your pee (urine) clear or pale yellow.  Keep your vagina and butt (rectum) clean. ? Wash the area with warm water every day. ? Wipe from front to back after you use the toilet.  Keep all follow-up visits as told by your doctor. This is important. Preventing this condition  Do not douche.  Use only warm water to wash around your vagina.  Use protection when you have sex. This includes: ? Latex condoms. ? Dental dams.  Limit how many people you have sex with. It is best to only have sex with the same person (be monogamous).  Get tested for STIs. Have your partner get tested.  Wear underwear that is cotton or lined with cotton.  Avoid tight pants and pantyhose. This is most important in summer.  Do not use any products that have nicotine or tobacco in them. These include cigarettes and e-cigarettes. If you need help quitting, ask your doctor.  Do not use illegal  drugs.  Limit how much alcohol you drink. Contact a doctor if:  Your symptoms do not get better, even after you are treated.  You have more discharge or pain when you pee (urinate).  You have a fever.  You have pain in your belly (abdomen).  You have pain with sex.  Your bleed from your vagina between periods. Summary  This infection happens when too many germs (bacteria) grow in the vagina.  Treating this condition can lower your risk for some infections from sex (STIs).  You should also treat this if you are pregnant. It can cause early (premature) birth.  Do not stop taking or using your antibiotic medicine even if you start to feel better. This information is not intended to replace advice given to you by your health care provider. Make sure you discuss any questions you have with your health care provider. Document Released: 07/27/2008 Document Revised: 07/03/2016 Document Reviewed: 07/03/2016 Elsevier Interactive Patient Education  2017 [employer]

## 2017-09-10 NOTE — MAU Note (Signed)
Pt presents to MAU with complaints of foul odor to her vaginal discharge and a knot on the inner thigh of her right leg. Delivered vaginally on November the 3rd

## 2017-09-10 NOTE — Progress Notes (Signed)
*  PRELIMINARY RESULTS* Vascular Ultrasound Right lower extremity venous duplex has been completed.  Preliminary findings: No evidence of deep vein thrombosis or baker's cyst in the right lower extremity.  Small segment of superficial vein thrombosis noted in the greater saphenous vein and its varicosities at the level of the knee. Preliminary results given to patients nurse @ 15:50.  Chauncey FischerCharlotte C Naudia Crosley 09/10/2017, 3:54 PM

## 2017-09-10 NOTE — Progress Notes (Addendum)
PP pt presents to triage for c/o "knots on legs and vaginal odor" that's been ongoing for past 3 days.   Pt delivered on Nov 3 and sent home on Nov 5.  1150: Provider at bs assessing. Wet prep collected and sent.   [facility]   1226: Vascular tech returned page. Informed need vascular U/s. She is stating may be couple hrs. Provider and pt made aware.   1422: Called [place] again to page vascular.   1520: vascular at bs.   1546: Negative for DVT but positive for SVT  1550: Provider made aware. Soda and water provided.   1610: Discharge instruction given with pt understanding. Pt left unit via ambulatory

## 2017-09-10 NOTE — MAU Provider Note (Signed)
History   [id number] s/p del and BTL of 09/04/17 in with painful knot and swelling of upper right leg and knee. Pt states she had it before she left hospital but did not think anything about it and thought it would go away but seems to be worsening. Also states odor to her lochia.  CSN: [id number]  Arrival date & time 09/10/17  1106   None     Chief Complaint  Patient presents with  . Vaginal Discharge  . knot in leg    HPI  Past Medical History:  Diagnosis Date  . Abnormal Pap smear    LEEP 2008  . Overactive bladder   . Vaginal Pap smear, abnormal     Past Surgical History:  Procedure Laterality Date  . INCONTINENCE SURGERY    . LEEP    . URETHRAL SLING    . WISDOM TOOTH EXTRACTION      Family History  Problem Relation Age of Onset  . Nephrolithiasis Mother   . Alcohol abuse Father        recovering  . Hypertension Father   . Heart disease Father        heart attack    Social History   Tobacco Use  . Smoking status: Former Games developermoker  . Smokeless tobacco: Never Used  Substance Use Topics  . Alcohol use: Yes    Comment: red wine every now and then- before and during preg  . Drug use: No    OB History    Gravida Para Term Preterm AB Living   7 3 3  0 4 3   SAB TAB Ectopic Multiple Live Births   4 0 0 0 3      Obstetric Comments   G1 and G2: early SAB G3: 09/2002 6lbs SVD G4: SAB G5: 01/2008 7lbs 7oz SVD G6: 10/2016 early SAB      Review of Systems  Constitutional: Negative.   Eyes: Negative.   Respiratory: Negative.   Cardiovascular: Negative.   Gastrointestinal: Negative.   Endocrine: Negative.   Genitourinary:       Vaginal odor  Musculoskeletal:       Knot in upper right leg proximal to knee, pain and tender to touch, also swelling of lower leg and knee  Skin: Negative.   Allergic/Immunologic: Negative.   Neurological: Negative.   Hematological: Negative.   Psychiatric/Behavioral: Negative.     Allergies  Azithromycin; Erythromycin; and  Tape  Home Medications    BP 130/71   Pulse 78   Temp 97.7 F (36.5 C)   Resp 18   Wt 204 lb (92.5 kg)   BMI 35.02 kg/m   Physical Exam  Constitutional: She is oriented to person, place, and time. She appears well-developed and well-nourished.  HENT:  Head: Normocephalic.  Eyes: Pupils are equal, round, and reactive to light.  Neck: Normal range of motion.  Cardiovascular: Normal rate, regular rhythm, normal heart sounds and intact distal pulses.  Pulmonary/Chest: Effort normal and breath sounds normal.  Abdominal: Soft. Bowel sounds are normal.  Genitourinary: Vagina normal and uterus normal.  Musculoskeletal: She exhibits edema and tenderness.  2cm tender knot in upper inner aspect of right leg proximal to knee, also some swelling of right leg.  Neurological: She is alert and oriented to person, place, and time. She has normal reflexes.  Skin: Skin is warm and dry.  Psychiatric: She has a normal mood and affect. Her behavior is normal. Judgment and thought content normal.    MAU  Course  Procedures (including critical care time)  Labs Reviewed  WET PREP, GENITAL  URINALYSIS, ROUTINE W REFLEX MICROSCOPIC   No results found.   1. Pain and swelling of right lower leg       MDM  VSS, Physical Exam  Constitutional: She is oriented to person, place, and time. She appears well-developed and well-nourished.  HENT:  Head: Normocephalic.  Eyes: Pupils are equal, round, and reactive to light.  Neck: Normal range of motion.  Cardiovascular: Normal rate, regular rhythm, normal heart sounds and intact distal pulses.  Pulmonary/Chest: Effort normal and breath sounds normal.  Abdominal: Soft. Bowel sounds are normal.  Genitourinary: Vagina normal and uterus normal.  Musculoskeletal: She exhibits edema and tenderness.  2cm tender knot in upper inner aspect of right leg proximal to knee, also some swelling of right leg.  Neurological: She is alert and oriented to person,  place, and time. She has normal reflexes.  Skin: Skin is warm and dry.  Psychiatric: She has a normal mood and affect. Her behavior is normal. Judgment and thought content normal.  Wet prep pos clue, lochia scant amt.  [place] shows superficial venous thrombosis discussed POC with Dr. Debroah LoopArnold pt to be d/c home elevation, warm heat, continue motrin and PE precautions given. Will d/c home and treat BV with flagyl.

## 2017-09-30 ENCOUNTER — Encounter: Payer: Self-pay | Admitting: Obstetrics & Gynecology

## 2017-09-30 ENCOUNTER — Ambulatory Visit (INDEPENDENT_AMBULATORY_CARE_PROVIDER_SITE_OTHER): Payer: Medicaid Other | Admitting: Obstetrics & Gynecology

## 2017-09-30 NOTE — Progress Notes (Signed)
Needs a note to returned to work due to blood clot

## 2017-09-30 NOTE — Progress Notes (Signed)
Subjective:     Heather AcostaDeborah E Christian is a 36 y.o. [phone] female who presents for a postpartum visit. She is four weeks postpartum following a vaginal delivery. I have fully reviewed the prenatal and intrapartum course. The delivery was at 39 gestational weeks. Outcome: vaginal. Anesthesia: Epidual. Postpartum course has been remarkable for diagnosis of superficial venous thrombosis (see prior notes). Baby's course has been unccomplicated. Baby is feeding by bottle. Bleeding yes. Bowel function is normal. Bladder function is normal. Patient is sexually active. Contraception method is tubal ligation. Postpartum depression screening: negative.  Review of Systems Pertinent items noted in HPI and remainder of comprehensive ROS otherwise negative.   Objective:    BP 107/72   Pulse 82   Wt 221 lb (100.2 kg)   Breastfeeding? No   BMI 37.93 kg/m   General:  alert and no distress   Breasts:  deferred  Lungs: clear to auscultation bilaterally  Heart:  regular rate and rhythm  Abdomen: soft, non-tender; bowel sounds normal; no masses,  no organomegaly   Vulva:  normal  Rectal Exam: Not performed.        Assessment:     Normal postpartum exam. Had normal pap in 01/2017.  Plan:    1. Contraception: tubal ligation 2. Work letter given to patient as requested 3. Follow up as needed.    Jaynie CollinsUGONNA  Vincen Bejar, MD, FACOG Attending Obstetrician & Gynecologist, [facility], [facility]

## 2017-09-30 NOTE — Patient Instructions (Signed)
Return to clinic for any scheduled appointments or for any gynecologic concerns as needed.   

## 2018-03-28 ENCOUNTER — Other Ambulatory Visit: Payer: Self-pay

## 2018-03-28 ENCOUNTER — Emergency Department (HOSPITAL_COMMUNITY): Payer: Self-pay

## 2018-03-28 ENCOUNTER — Emergency Department (HOSPITAL_COMMUNITY)
Admission: EM | Admit: 2018-03-28 | Discharge: 2018-03-28 | Disposition: A | Discharge status: Home / Self Care | Payer: Self-pay | Attending: Emergency Medicine | Admitting: Emergency Medicine

## 2018-03-28 ENCOUNTER — Encounter (HOSPITAL_COMMUNITY): Payer: Self-pay

## 2018-03-28 DIAGNOSIS — Z87891 Personal history of nicotine dependence: Secondary | ICD-10-CM | POA: Insufficient documentation

## 2018-03-28 DIAGNOSIS — R109 Unspecified abdominal pain: Secondary | ICD-10-CM

## 2018-03-28 DIAGNOSIS — Z79899 Other long term (current) drug therapy: Secondary | ICD-10-CM | POA: Insufficient documentation

## 2018-03-28 DIAGNOSIS — R1011 Right upper quadrant pain: Secondary | ICD-10-CM | POA: Insufficient documentation

## 2018-03-28 LAB — CBC
HEMATOCRIT: 40.2 % (ref 36.0–46.0)
Hemoglobin: 13.2 g/dL (ref 12.0–15.0)
MCH: 28.6 pg (ref 26.0–34.0)
MCHC: 32.8 g/dL (ref 30.0–36.0)
MCV: 87.2 fL (ref 78.0–100.0)
Platelets: 351 10*3/uL (ref 150–400)
RBC: 4.61 MIL/uL (ref 3.87–5.11)
RDW: 12.6 % (ref 11.5–15.5)
WBC: 9.4 10*3/uL (ref 4.0–10.5)

## 2018-03-28 LAB — COMPREHENSIVE METABOLIC PANEL
ALT: 19 U/L (ref 14–54)
AST: 16 U/L (ref 15–41)
Albumin: 3.6 g/dL (ref 3.5–5.0)
Alkaline Phosphatase: 72 U/L (ref 38–126)
Anion gap: 9 (ref 5–15)
BUN: 9 mg/dL (ref 6–20)
CO2: 24 mmol/L (ref 22–32)
Calcium: 9.2 mg/dL (ref 8.9–10.3)
Chloride: 107 mmol/L (ref 101–111)
Creatinine, Ser: 0.56 mg/dL (ref 0.44–1.00)
GFR calc Af Amer: >60 mL/min (ref >60)
Glucose, Bld: 94 mg/dL (ref 65–99)
Potassium: 3.8 mmol/L (ref 3.5–5.1)
Sodium: 140 mmol/L (ref 135–145)
Total Bilirubin: 0.5 mg/dL (ref 0.3–1.2)
Total Protein: 6.7 g/dL (ref 6.5–8.1)

## 2018-03-28 LAB — URINALYSIS, ROUTINE W REFLEX MICROSCOPIC
BILIRUBIN URINE: NEGATIVE
Glucose, UA: NEGATIVE mg/dL
Hgb urine dipstick: NEGATIVE
KETONES UR: NEGATIVE mg/dL
Nitrite: NEGATIVE
PROTEIN: NEGATIVE mg/dL
Specific Gravity, Urine: 1.018 (ref 1.005–1.030)
pH: 5 (ref 5.0–8.0)

## 2018-03-28 LAB — I-STAT BETA HCG BLOOD, ED (MC, WL, AP ONLY)

## 2018-03-28 LAB — LIPASE, BLOOD: Lipase: 37 U/L (ref 11–51)

## 2018-03-28 MED ORDER — IOHEXOL 300 MG/ML  SOLN
100.0000 mL | Freq: Once | INTRAMUSCULAR | Status: AC | PRN
  Administered 2018-03-28: 100 mL via INTRAVENOUS

## 2018-03-28 NOTE — Discharge Instructions (Addendum)
Please read attached information. If you experience any new or worsening signs or symptoms please return to the emergency room for evaluation. Please follow-up with your primary care provider or specialist as discussed.  °

## 2018-03-28 NOTE — ED Triage Notes (Signed)
Pt presents with 1 month h/o R sided abdominal pain.  Pt reports pain worsens after she eats, but today, pain has been constant.  Pt reports pain began to RLQ and now radiates to RUQ.  +nausea and diarrhea; pt denies any dysuria or vaginal discharge.

## 2018-03-28 NOTE — ED Notes (Signed)
Patient transported to Ultrasound 

## 2018-03-28 NOTE — ED Provider Notes (Signed)
[facility] EMERGENCY DEPARTMENT Provider Note   CSN: [id number] Arrival date & time: 03/28/18  1642     History   Chief Complaint Chief Complaint  Patient presents with  . Abdominal Pain    HPI Heather Christian is a 37 y.o. female.  HPI     37 year old female presents today with complaints of abdominal pain.  Patient notes 1 month history of right-sided abdominal pain, she notes this is both right upper and right lower, but has been more persistently right lower quadrant.  She notes pain is worse when eating, she notes some nausea and diarrhea, denies any vomiting, denies any vaginal discharge or bleeding, denies any fever.  Patient notes pain is worse when walking as well.  Patient denies any history of the same.     Past Medical History:  Diagnosis Date  . Abnormal Pap smear    LEEP 2008  . Overactive bladder   . Vaginal Pap smear, abnormal     There are no active problems to display for this patient.   Past Surgical History:  Procedure Laterality Date  . INCONTINENCE SURGERY    . LEEP    . TUBAL LIGATION Bilateral 09/04/2017   Procedure: POST PARTUM TUBAL LIGATION;  Surgeon: Reva Bores, MD;  Location: [facility] BIRTHING SUITES;  Service: Gynecology;  Laterality: Bilateral;  . URETHRAL SLING    . WISDOM TOOTH EXTRACTION       OB History    Gravida  7   Para  3   Term  3   Preterm  0   AB  4   Living  3     SAB  4   TAB  0   Ectopic  0   Multiple  0   Live Births  3        Obstetric Comments  G1 and G2: early SAB G3: 09/2002 6lbs SVD G4: SAB G5: 01/2008 7lbs 7oz SVD G6: 10/2016 early SAB         Home Medications    Prior to Admission medications   Medication Sig Start Date End Date Taking? Authorizing Provider  cetirizine (ZYRTEC) 10 MG tablet Take 10 mg by mouth daily as needed for allergies.    [provider]  fluticasone (FLONASE) 50 MCG/ACT nasal spray Place 1-2 sprays into both nostrils daily as  needed for rhinitis.     [provider]  hydrocortisone (ANUSOL-HC) 25 MG suppository Place 1 suppository (25 mg total) rectally 2 (two) times daily as needed for hemorrhoids or anal itching. Patient not taking: Reported on 09/30/2017 08/22/17   Anyanwu, Jethro Bastos, MD  metroNIDAZOLE (FLAGYL) 500 MG tablet Take 1 tablet (500 mg total) 2 (two) times daily by mouth. Patient not taking: Reported on 09/30/2017 09/10/17   Montez Morita, CNM  omeprazole (PRILOSEC) 20 MG capsule Take 20 mg 2 (two) times daily before a meal by mouth.    [provider]  oxyCODONE (OXY IR/ROXICODONE) 5 MG immediate release tablet Take 1 tablet (5 mg total) every 4 (four) hours as needed by mouth (pain scale 4-7). Patient not taking: Reported on 09/30/2017 09/05/17   Aviva Signs, CNM  phenylephrine-shark liver oil-mineral oil-petrolatum (PREPARATION H) 0.25-3-14-71.9 % rectal ointment Place 1 application rectally 3 (three) times daily as needed for hemorrhoids.    [provider]  Prenatal Vit-Fe Fumarate-FA (PRENATAL MULTIVITAMIN) TABS tablet Take 1 tablet by mouth daily.     [provider]  ranitidine (ZANTAC)  150 MG tablet Take 150 mg at bedtime by mouth.    [provider]    Family History Family History  Problem Relation Age of Onset  . Nephrolithiasis Mother   . Alcohol abuse Father        recovering  . Hypertension Father   . Heart disease Father        heart attack    Social History Social History   Tobacco Use  . Smoking status: Former Games developer  . Smokeless tobacco: Never Used  Substance Use Topics  . Alcohol use: Yes    Comment: red wine every now and then- before and during preg  . Drug use: No     Allergies   Azithromycin; Erythromycin; and Tape   Review of Systems Review of Systems  All other systems reviewed and are negative.   Physical Exam Updated Vital Signs BP (!) 135/94   Pulse (!) 59   Temp 98.2 F (36.8 C) (Oral)   Resp  18   Ht  (1.626 m)   Wt 93 kg (205 lb)   LMP 03/14/2018   SpO2 97%   BMI 35.19 kg/m   Physical Exam  Constitutional: She is oriented to person, place, and time. She appears well-developed and well-nourished.  HENT:  Head: Normocephalic and atraumatic.  Eyes: Pupils are equal, round, and reactive to light. Conjunctivae are normal. Right eye exhibits no discharge. Left eye exhibits no discharge. No scleral icterus.  Neck: Normal range of motion. No JVD present. No tracheal deviation present.  Pulmonary/Chest: Effort normal. No stridor.  Abdominal:  Tenderness palpation of the right lower quadrant, minor tenderness palpation her right upper quadrant, left abdomen nontender, no rebound guarding or masses  Neurological: She is alert and oriented to person, place, and time. Coordination normal.  Psychiatric: She has a normal mood and affect. Her behavior is normal. Judgment and thought content normal.  Nursing note and vitals reviewed.  ED Treatments / Results  Labs (all labs ordered are listed, but only abnormal results are displayed) Labs Reviewed  URINALYSIS, ROUTINE W REFLEX MICROSCOPIC - Abnormal; Notable for the following components:      Result Value   Leukocytes, UA TRACE (*)    Bacteria, UA RARE (*)    All other components within normal limits  LIPASE, BLOOD  COMPREHENSIVE METABOLIC PANEL  CBC  I-STAT BETA HCG BLOOD, ED ([facility], [facility], [facility] ONLY)    EKG None  Radiology Ct Abdomen Pelvis W Contrast  Result Date: 03/28/2018 CLINICAL DATA:  Chronic right lower quadrant abdominal pain. EXAM: CT ABDOMEN AND PELVIS WITH CONTRAST TECHNIQUE: Multidetector CT imaging of the abdomen and pelvis was performed using the standard protocol following bolus administration of intravenous contrast. CONTRAST:  OMNIPAQUE IOHEXOL 300 MG/ML  SOLN COMPARISON:  Right upper quadrant ultrasound performed earlier today at 7:29 p.m., and pelvic ultrasound performed 07/15/2017 FINDINGS: Lower chest:  The visualized lung bases are grossly clear. The visualized portions of the mediastinum are unremarkable. A small to moderate hiatal hernia is noted. Hepatobiliary: The liver is unremarkable in appearance. The gallbladder is unremarkable in appearance. The common bile duct remains normal in caliber. Pancreas: The pancreas is within normal limits. Spleen: The spleen is unremarkable in appearance. Adrenals/Urinary Tract: The adrenal glands are unremarkable in appearance. The kidneys are within normal limits. There is no evidence of hydronephrosis. No renal or ureteral stones are identified. No perinephric stranding is seen. Stomach/Bowel: The stomach is unremarkable in appearance. The small bowel is within  normal limits. The appendix is normal in caliber, without evidence of appendicitis. The colon is unremarkable in appearance. Vascular/Lymphatic: The abdominal aorta is unremarkable in appearance. The inferior vena cava is grossly unremarkable. No retroperitoneal lymphadenopathy is seen. No pelvic sidewall lymphadenopathy is identified. Reproductive: The bladder is mildly distended and within normal limits. The uterus is grossly unremarkable in appearance. The ovaries are relatively symmetric. No suspicious adnexal masses are seen. Bilateral tubal ligation clips are noted. Other: No additional soft tissue abnormalities are seen. Musculoskeletal: No acute osseous abnormalities are identified. Mild facet disease is noted at the lower lumbar spine. The visualized musculature is unremarkable in appearance. IMPRESSION: 1. No acute abnormality seen within the abdomen or pelvis. 2. Small to moderate hiatal hernia noted. Electronically Signed   By: Roanna Raider M.D.   On: 03/28/2018 21:49   US Abdomen Limited Ruq  Result Date: 03/28/2018 CLINICAL DATA:  Initial evaluation for acute right upper quadrant pain. EXAM: ULTRASOUND ABDOMEN LIMITED RIGHT UPPER QUADRANT COMPARISON:  None. FINDINGS: Gallbladder: No gallstones or  wall thickening visualized. No sonographic Murphy sign noted by sonographer. Common bile duct: Diameter: 4.5 mm Liver: No focal lesion identified. Diffusely increased echogenicity, suggesting steatosis. Portal vein is patent on color Doppler imaging with normal direction of blood flow towards the liver. IMPRESSION: 1. Normal sonographic evaluation of the gallbladder. No cholelithiasis, evidence for acute cholecystitis, or biliary dilatation. 2. Diffusely increased hepatic echogenicity, suggesting steatosis. Electronically Signed   By: Rise Mu M.D.   On: 03/28/2018 20:06    Procedures Procedures (including critical care time)  Medications Ordered in ED Medications  iohexol (OMNIPAQUE) 300 MG/ML solution 100 mL (100 mLs Intravenous Contrast Given 03/28/18 2129)     Initial Impression / Assessment and Plan / ED Course  I have reviewed the triage vital signs and the nursing notes.  Pertinent labs & imaging results that were available during my care of the patient were reviewed by me and considered in my medical decision making (see chart for details).      Final Clinical Impressions(s) / ED Diagnoses   Final diagnoses:  RUQ pain  Abdominal pain, unspecified abdominal location    Labs: I-STAT beta-hCG, lipase, CMP, CBC, urinalysis  Imaging: Ultrasound abdomen right upper quadrant, CT abdomen pelvis with contrast  Consults:  Therapeutics:  Discharge Meds:   Assessment/Plan: 37 year old female presents today with complaints of abdominal pain.  Uncertain etiology at this time although I have very low suspicion for acute intra-abdominal pathology given reassuring ultrasound and CT, no signs of infectious etiology.  Patient discharged with outpatient follow-up and strict return precautions.  She verbalized understanding and agreement to today's plan had no further questions or concerns    ED Discharge Orders    None       Rosalio Loud 03/29/18 2154      Wynetta Fines, MD 04/01/18 1510

## 2018-03-28 NOTE — ED Notes (Signed)
Patient transported to CT 

## 2018-07-02 ENCOUNTER — Other Ambulatory Visit: Payer: Self-pay

## 2018-07-02 ENCOUNTER — Ambulatory Visit (HOSPITAL_COMMUNITY)
Admission: EM | Admit: 2018-07-02 | Discharge: 2018-07-02 | Disposition: A | Discharge status: Home / Self Care | Payer: Medicaid Other | Attending: Family Medicine | Admitting: Family Medicine

## 2018-07-02 ENCOUNTER — Ambulatory Visit (INDEPENDENT_AMBULATORY_CARE_PROVIDER_SITE_OTHER): Payer: Self-pay

## 2018-07-02 ENCOUNTER — Encounter (HOSPITAL_COMMUNITY): Payer: Self-pay

## 2018-07-02 DIAGNOSIS — M25561 Pain in right knee: Secondary | ICD-10-CM

## 2018-07-02 MED ORDER — MELOXICAM 7.5 MG PO TABS: 7.5000 mg | ORAL_TABLET | Freq: Every day | ORAL | 0 refills | Status: AC

## 2018-07-02 NOTE — ED Triage Notes (Signed)
Pt presents to [facility] for rt leg injury since last night 07/01/18, pt states she stepped in mud  And felt like something popped in her leg. Pt is having a hard time walking. Pt has taken Ibuprofen but has no relief

## 2018-07-02 NOTE — ED Provider Notes (Signed)
[facility]    CSN: [id number] Arrival date & time: 07/02/18  1000     History   Chief Complaint Chief Complaint  Patient presents with  . Leg Injury    Rt    HPI Heather Christian is a 37 y.o. female.   37 year old female comes in for evaluation of right knee pain after injury last night.  States she stepped down into mud, knee twisted, and had a pop.  Now with painful weightbearing to the posterior knee.  She is unable to flex her knee due to pain.  She is some swelling to the back of the knee.  She has numbness, tingling to the toes at baseline, denies worsening. Has been taking ibuprofen without relief.     Past Medical History:  Diagnosis Date  . Abnormal Pap smear    LEEP 2008  . Overactive bladder   . Vaginal Pap smear, abnormal     There are no active problems to display for this patient.   Past Surgical History:  Procedure Laterality Date  . INCONTINENCE SURGERY    . LEEP    . TUBAL LIGATION Bilateral 09/04/2017   Procedure: POST PARTUM TUBAL LIGATION;  Surgeon: Reva Bores, MD;  Location: [facility] BIRTHING SUITES;  Service: Gynecology;  Laterality: Bilateral;  . URETHRAL SLING    . WISDOM TOOTH EXTRACTION      OB History    Gravida  7   Para  3   Term  3   Preterm  0   AB  4   Living  3     SAB  4   TAB  0   Ectopic  0   Multiple  0   Live Births  3        Obstetric Comments  G1 and G2: early SAB G3: 09/2002 6lbs SVD G4: SAB G5: 01/2008 7lbs 7oz SVD G6: 10/2016 early SAB         Home Medications    Prior to Admission medications   Medication Sig Start Date End Date Taking? Authorizing Provider  cetirizine (ZYRTEC) 10 MG tablet Take 10 mg by mouth daily as needed for allergies.   Yes [provider]  fluticasone (FLONASE) 50 MCG/ACT nasal spray Place 1-2 sprays into both nostrils daily as needed for rhinitis.    Yes [provider]  omeprazole (PRILOSEC) 20 MG capsule Take 20 mg 2 (two) times  daily before a meal by mouth.   Yes [provider]  ranitidine (ZANTAC) 150 MG tablet Take 150 mg at bedtime by mouth.   Yes [provider]  hydrocortisone (ANUSOL-HC) 25 MG suppository Place 1 suppository (25 mg total) rectally 2 (two) times daily as needed for hemorrhoids or anal itching. Patient not taking: Reported on 09/30/2017 08/22/17   Anyanwu, Jethro Bastos, MD  meloxicam (MOBIC) 7.5 MG tablet Take 1 tablet (7.5 mg total) by mouth daily. 07/02/18   Cathie Hoops, Amy V, PA-C  phenylephrine-shark liver oil-mineral oil-petrolatum (PREPARATION H) 0.25-3-14-71.9 % rectal ointment Place 1 application rectally 3 (three) times daily as needed for hemorrhoids.    [provider]  Prenatal Vit-Fe Fumarate-FA (PRENATAL MULTIVITAMIN) TABS tablet Take 1 tablet by mouth daily.     [provider]    Family History Family History  Problem Relation Age of Onset  . Nephrolithiasis Mother   . Alcohol abuse Father        recovering  . Hypertension Father   . Heart disease Father  heart attack    Social History Social History   Tobacco Use  . Smoking status: Former Games developer  . Smokeless tobacco: Never Used  Substance Use Topics  . Alcohol use: Yes    Comment: red wine every now and then- before and during preg  . Drug use: No     Allergies   Azithromycin; Erythromycin; and Tape   Review of Systems Review of Systems  Reason unable to perform ROS: See HPI as above.     Physical Exam Triage Vital Signs ED Triage Vitals  Enc Vitals Group     BP      Pulse      Resp      Temp      Temp src      SpO2      Weight      Height      Head Circumference      Peak Flow      Pain Score      Pain Loc      Pain Edu?      Excl. in GC?    No data found.  Updated Vital Signs BP 107/71 (BP Location: Left Arm)   Pulse 67   Temp 97.7 F (36.5 C) (Oral)   Resp 16   LMP 06/26/2018 (Exact Date)   SpO2 97%   Physical Exam  Constitutional: She is oriented  to person, place, and time. She appears well-developed and well-nourished. No distress.  HENT:  Head: Normocephalic and atraumatic.  Eyes: Pupils are equal, round, and reactive to light. Conjunctivae are normal.  Musculoskeletal:  Exam limited as patient unable to flex knee and having trouble ambulating on to exam table. No obvious swelling, erythema, warmth, contusion seen. Mild swelling to posterior knee from palpation. Tenderness to palpation of the posterior knee. Decreased ROM, particularly flexion of the knee. Strength on extension 4/5, unable to do strength on flexion. Sensation intact and equal.   Full ROM of ankle. Unable to do strength of ankle as pain to posterior calf. Sensation intact. Pedal pulse 2+, cap refill <2s  Neurological: She is alert and oriented to person, place, and time.  Skin: She is not diaphoretic.     UC Treatments / Results  Labs (all labs ordered are listed, but only abnormal results are displayed) Labs Reviewed - No data to display  EKG None  Radiology Dg Knee Complete 4 Views Right  Result Date: 07/02/2018 CLINICAL DATA:  The patient felt a pop in the right knee with onset of pain when she stepped into mud last night. Initial encounter. EXAM: RIGHT KNEE - COMPLETE 4+ VIEW COMPARISON:  None. FINDINGS: No evidence of fracture, dislocation, or joint effusion. No evidence of arthropathy or other focal bone abnormality. Soft tissues are unremarkable. IMPRESSION: Negative exam. Electronically Signed   By: Drusilla Kanner M.D.   On: 07/02/2018 11:00    Procedures Procedures (including critical care time)  Medications Ordered in UC Medications - No data to display  Initial Impression / Assessment and Plan / UC Course  I have reviewed the triage vital signs and the nursing notes.  Pertinent labs & imaging results that were available during my care of the patient were reviewed by me and considered in my medical decision making (see chart for details).      X-ray negative for fracture or dislocation.  NSAIDs, ice compress, elevation, knee sleeve during activity.  Patient has crutches at home, can use for comfort.  Return precautions given.  Patient expresses understanding and agrees to plan.  Final Clinical Impressions(s) / UC Diagnoses   Final diagnoses:  Acute pain of right knee    ED Prescriptions    Medication Sig Dispense Auth. Provider   meloxicam (MOBIC) 7.5 MG tablet Take 1 tablet (7.5 mg total) by mouth daily. 15 tablet Threasa Alpha, [place] 07/02/18 1136

## 2018-07-02 NOTE — Discharge Instructions (Signed)
X-ray negative for fracture dislocation. Start Mobic. Do not take ibuprofen (motrin/advil)/ naproxen (aleve) while on mobic. Tylenol for breakthrough pain.  Ice compress, elevation, knee sleeve during activity.  You can use crutches for the first few days to help with symptoms.  If experiencing worsening symptoms, symptoms not improving, continue to decrease of knee motion, follow-up with orthopedics for further evaluation.  If experiencing numbness to the toes, discoloration, cold clammy toes, go to the emergency department for further evaluation.

## 2018-09-07 IMAGING — CT CT ABD-PELV W/ CM
2 of 4 series · 15 of 46 positions shown, 17 images · IV contrast (APPLIED)
Comparison: Right upper quadrant ultrasound performed earlier today
at [DATE] p.m., and pelvic ultrasound performed 07/15/2017

CLINICAL DATA: Chronic right lower quadrant abdominal pain.

EXAM:
CT ABDOMEN AND PELVIS WITH CONTRAST
TECHNIQUE: Multidetector CT imaging of the abdomen and pelvis was performed
using the standard protocol following bolus administration of
intravenous contrast.
CONTRAST:  100mL OMNIPAQUE IOHEXOL 300 MG/ML  SOLN

[Series 3: abd/ pelvis 5.0 i30f 2 · axial · 0.91mm/px · z∈[+861,+1291]mm · 12 of 96 slices shown, 14 images]
[im 5/96  soft-tissue]
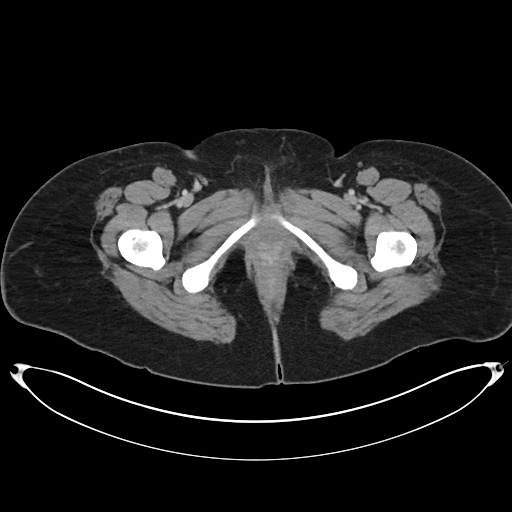
[im 5/96  bone]
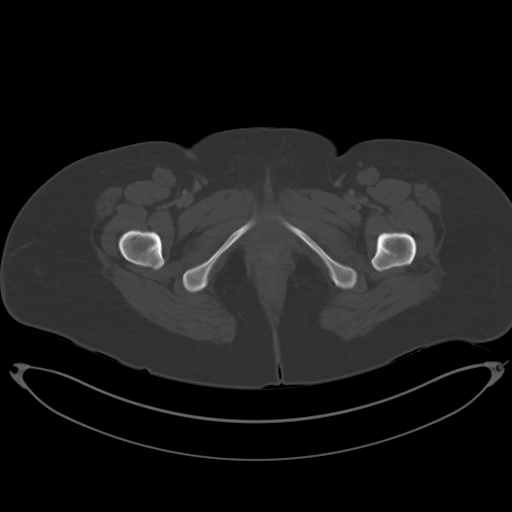
[im 14/96  soft-tissue]
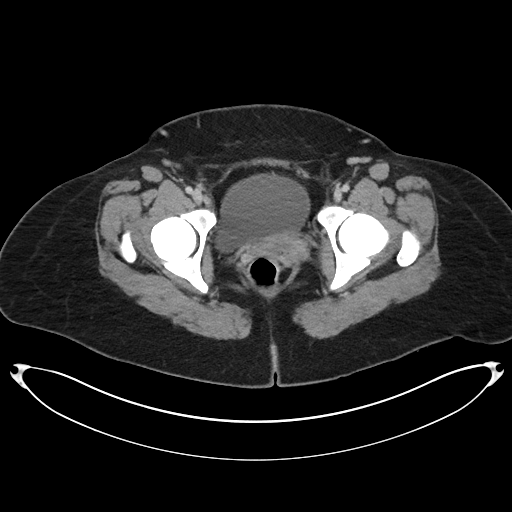
[im 23/96  soft-tissue]
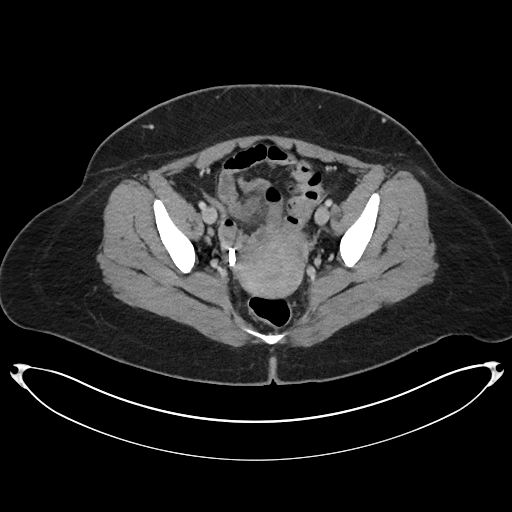
[im 28/96  soft-tissue]
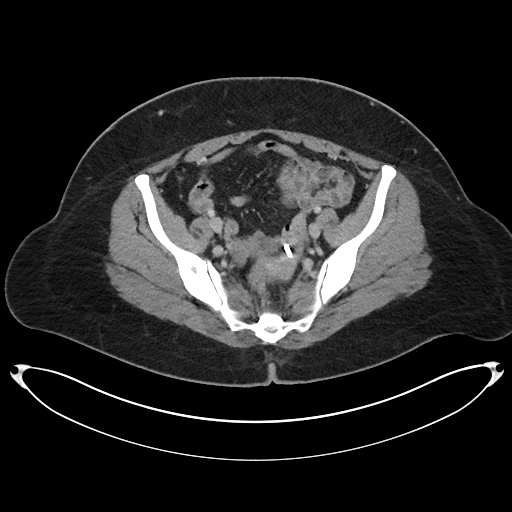
[im 37/96  soft-tissue]
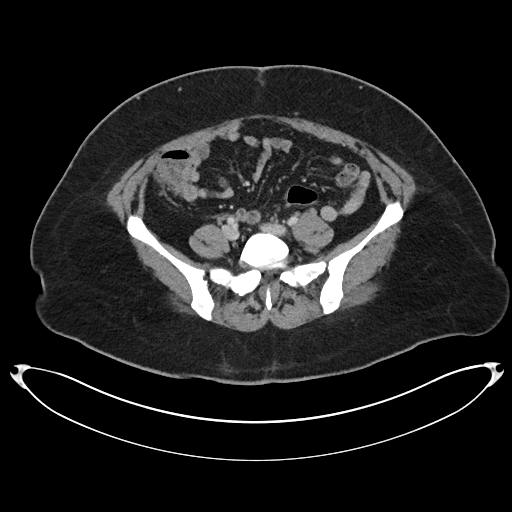
[im 46/96  soft-tissue]
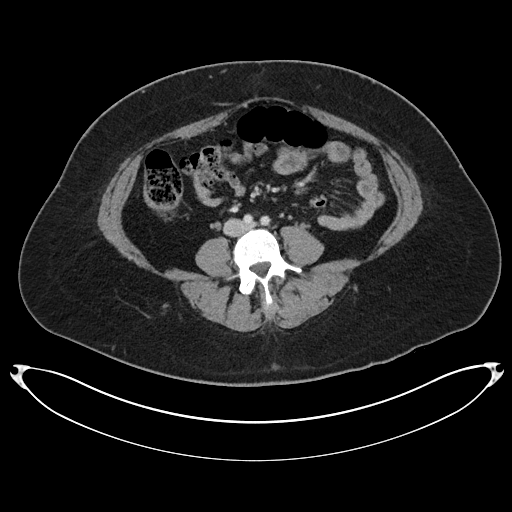
[im 50/96  soft-tissue]
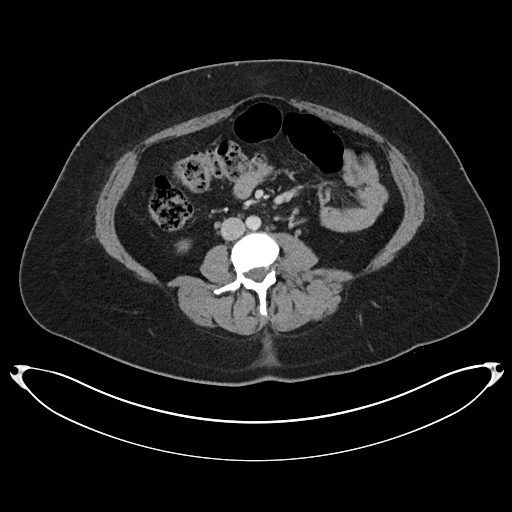
[im 59/96  soft-tissue]
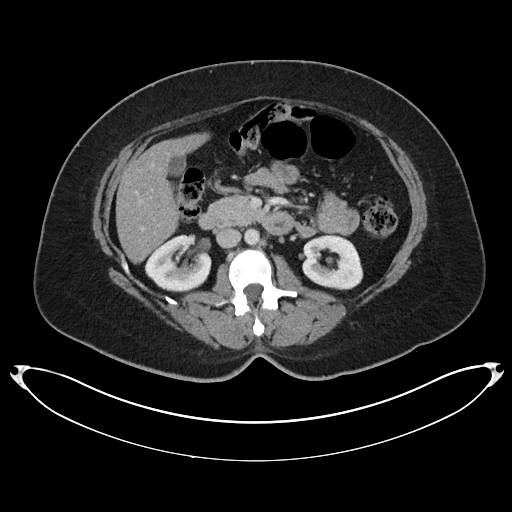
[im 68/96  soft-tissue]
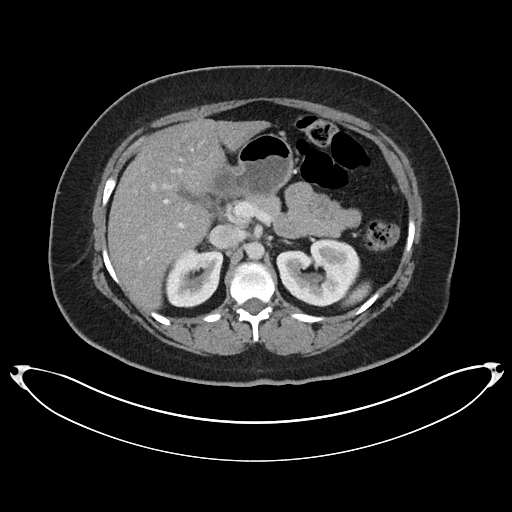
[im 68/96  bone]
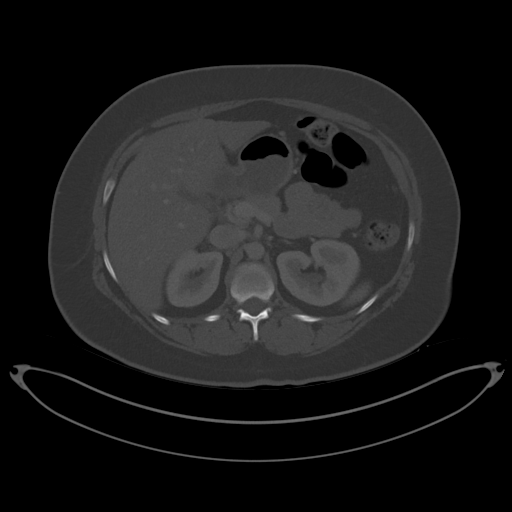
[im 73/96  soft-tissue]
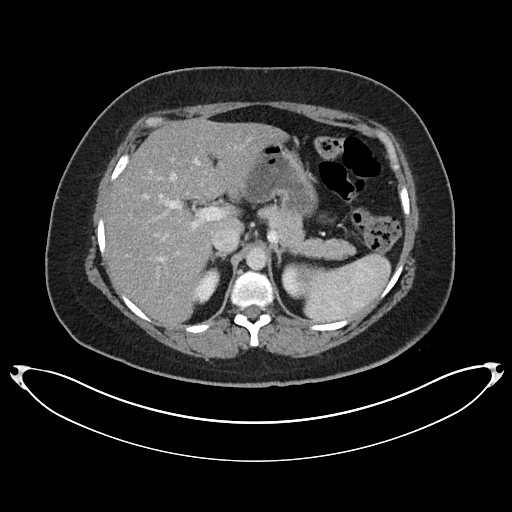
[im 82/96  soft-tissue]
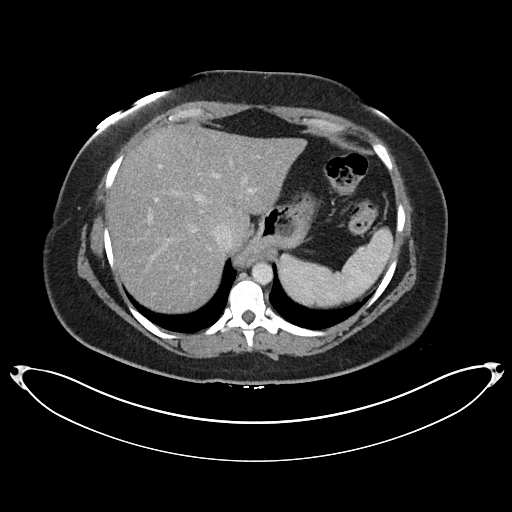
[im 91/96  soft-tissue]
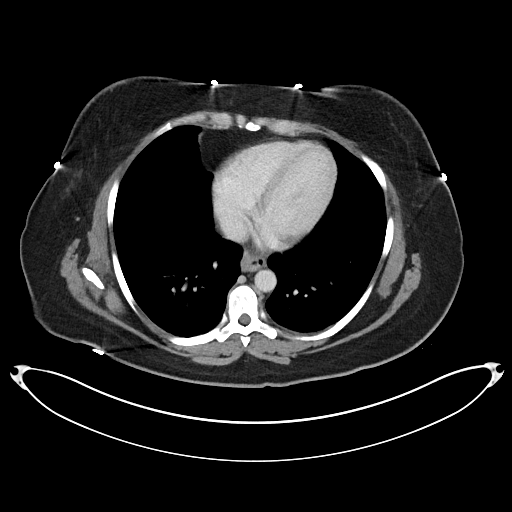

[Series 6: coronal soft tissue · coronal · 0.71mm/px · 3 of 93 slices shown]
[im 31/93  soft-tissue]
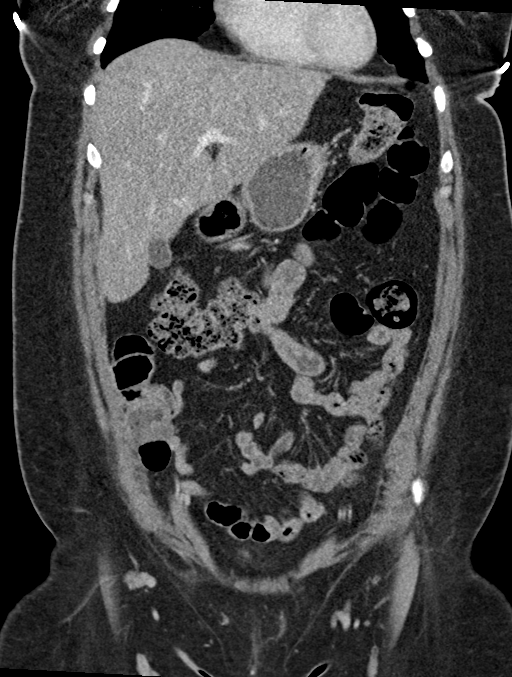
[im 41/93  soft-tissue]
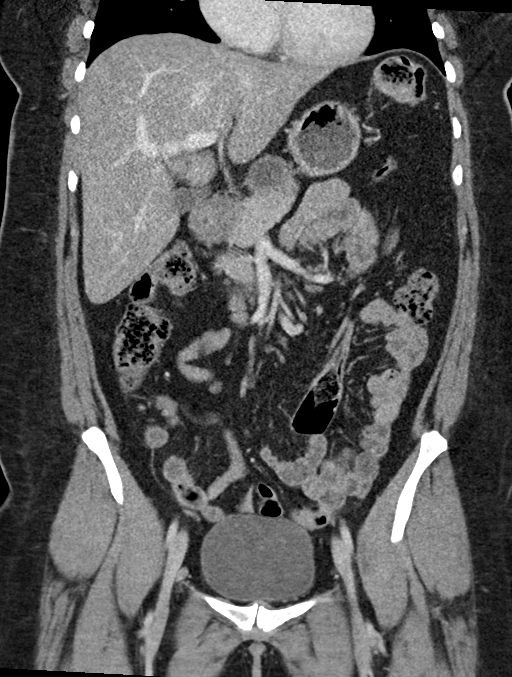
[im 52/93  soft-tissue]
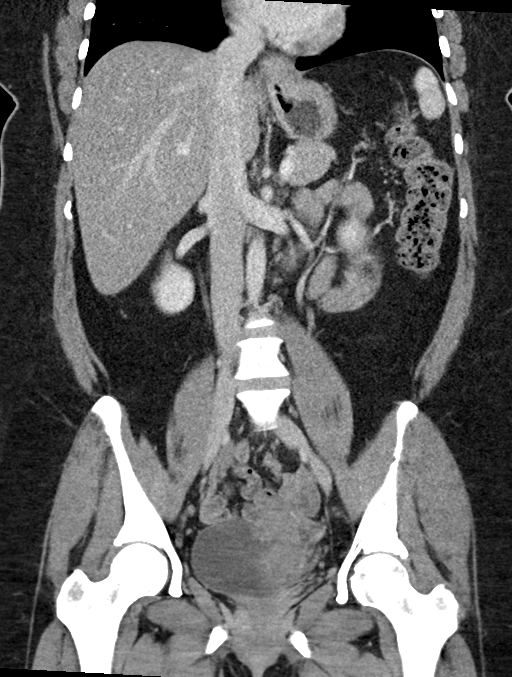

[15 of 46 positions shown; findings below may reference images not displayed]

FINDINGS: Lower chest: The visualized lung bases are grossly clear. The
visualized portions of the mediastinum are unremarkable. A small to
moderate hiatal hernia is noted.

Hepatobiliary: The liver is unremarkable in appearance. The
gallbladder is unremarkable in appearance. The common bile duct
remains normal in caliber.

Pancreas: The pancreas is within normal limits.

Spleen: The spleen is unremarkable in appearance.

Adrenals/Urinary Tract: The adrenal glands are unremarkable in
appearance. The kidneys are within normal limits. There is no
evidence of hydronephrosis. No renal or ureteral stones are
identified. No perinephric stranding is seen.

Stomach/Bowel: The stomach is unremarkable in appearance. The small
bowel is within normal limits. The appendix is normal in caliber,
without evidence of appendicitis. The colon is unremarkable in
appearance.

Vascular/Lymphatic: The abdominal aorta is unremarkable in
appearance. The inferior vena cava is grossly unremarkable. No
retroperitoneal lymphadenopathy is seen. No pelvic sidewall
lymphadenopathy is identified.

Reproductive: The bladder is mildly distended and within normal
limits. The uterus is grossly unremarkable in appearance. The
ovaries are relatively symmetric. No suspicious adnexal masses are
seen. Bilateral tubal ligation clips are noted.

Other: No additional soft tissue abnormalities are seen.

Musculoskeletal: No acute osseous abnormalities are identified. Mild
facet disease is noted at the lower lumbar spine. The visualized
musculature is unremarkable in appearance.
IMPRESSION: 1. No acute abnormality seen within the abdomen or pelvis.
2. Small to moderate hiatal hernia noted.

## 2019-10-13 ENCOUNTER — Other Ambulatory Visit: Payer: Self-pay

## 2019-10-13 DIAGNOSIS — Z20822 Contact with and (suspected) exposure to covid-19: Secondary | ICD-10-CM

## 2019-10-14 ENCOUNTER — Telehealth: Payer: Self-pay | Admitting: Critical Care Medicine

## 2019-10-14 LAB — NOVEL CORONAVIRUS, NAA: SARS-CoV-2, NAA: DETECTED — AB

## 2019-10-14 NOTE — Telephone Encounter (Signed)
-----   Message from Carlisle Beers, RN sent at 10/14/2019  4:17 PM EST -----  ----- Message ----- From: Interface, [facility] Lab Results In Sent: 10/14/2019   2:36 PM EST To: Mobile Screening Testing Result Pool

## 2019-10-14 NOTE — Telephone Encounter (Signed)
I connected with this patient who is Covid positive.  She tested December 12.  She had symptoms of headache eye irritation and some cough muscle aches.  She is not a monoclonal antibody candidate.  She has had several family members test positive as well.  She knows she will need to stay in isolation at least until December 20.

## 2020-04-10 ENCOUNTER — Ambulatory Visit: Payer: Medicaid Other | Admitting: Internal Medicine

## 2020-05-01 ENCOUNTER — Other Ambulatory Visit: Payer: Self-pay | Admitting: *Deleted

## 2020-05-01 DIAGNOSIS — Z1231 Encounter for screening mammogram for malignant neoplasm of breast: Secondary | ICD-10-CM

## 2020-05-07 IMAGING — US US ABDOMEN LIMITED
1 series · 14 of 25 positions shown · non-contrast
Comparison: None.

CLINICAL DATA: Initial evaluation for acute right upper quadrant
pain.

EXAM:
ULTRASOUND ABDOMEN LIMITED RIGHT UPPER QUADRANT

[Series 1: us abdomen limited · 0.22mm/px · 14 of 41 slices shown]
[im 1/41]
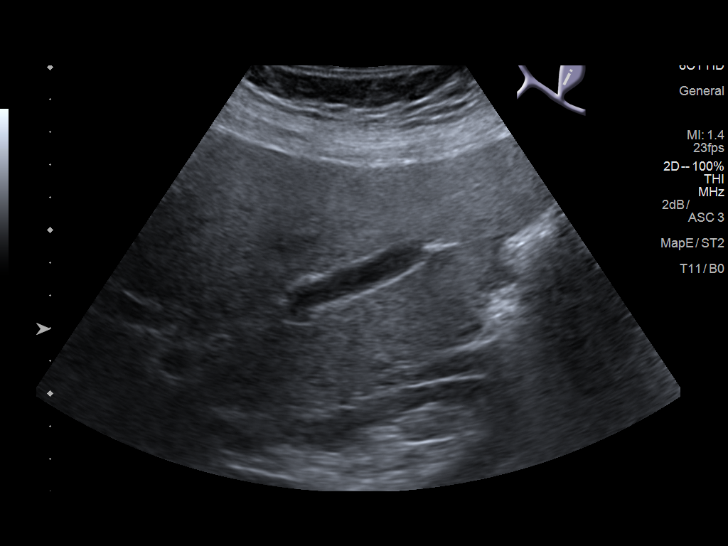
[im 4/41]
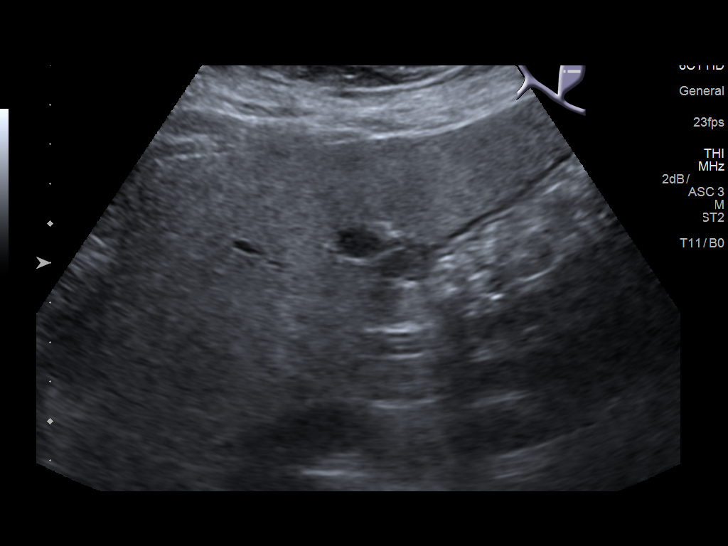
[im 7/41]
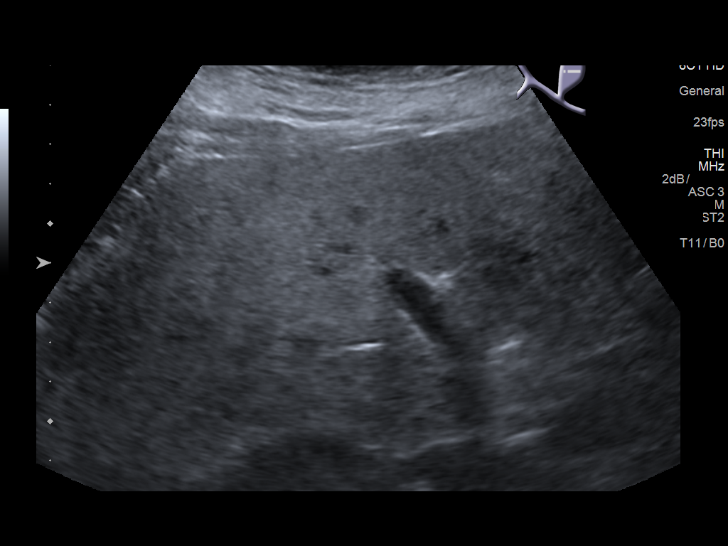
[im 11/41]
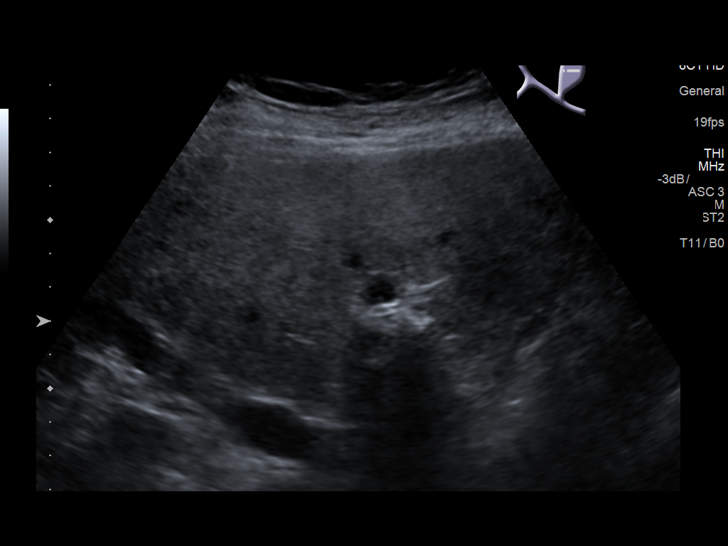
[im 14/41]
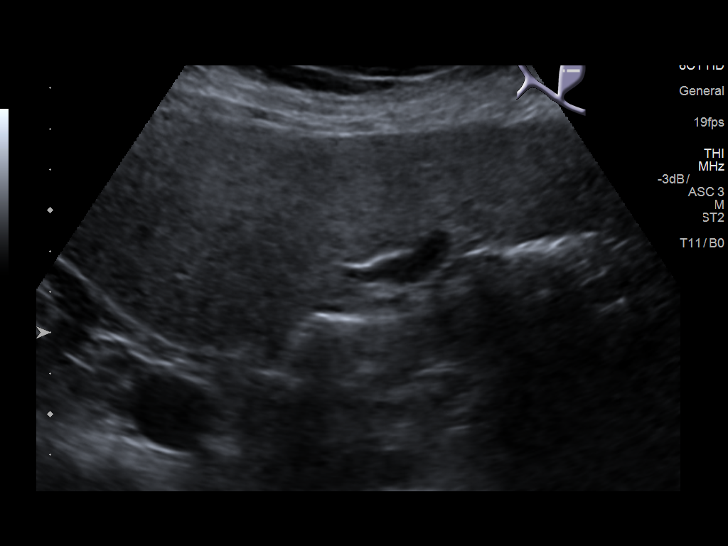
[im 16/41]
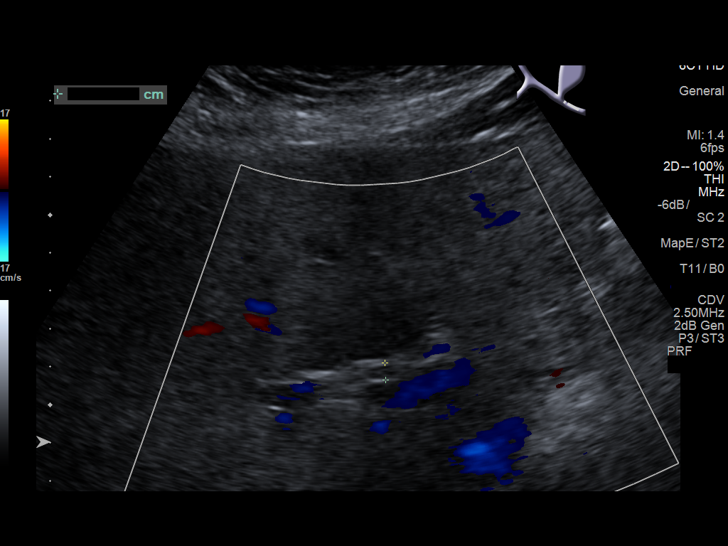
[im 19/41]
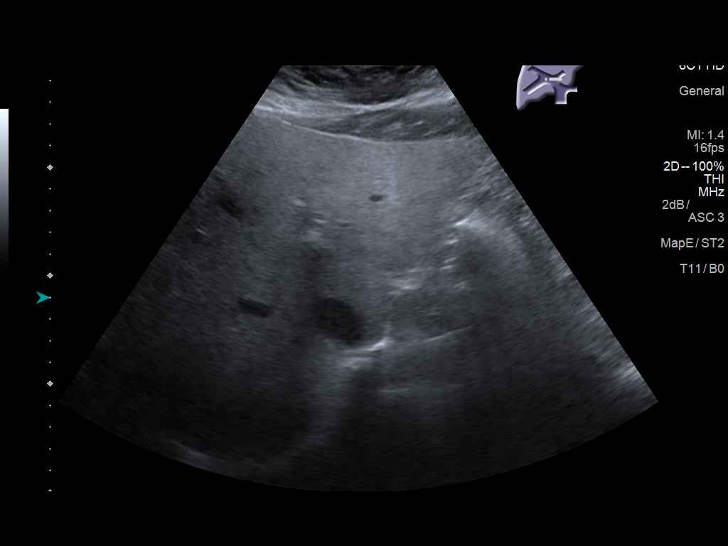
[im 22/41]
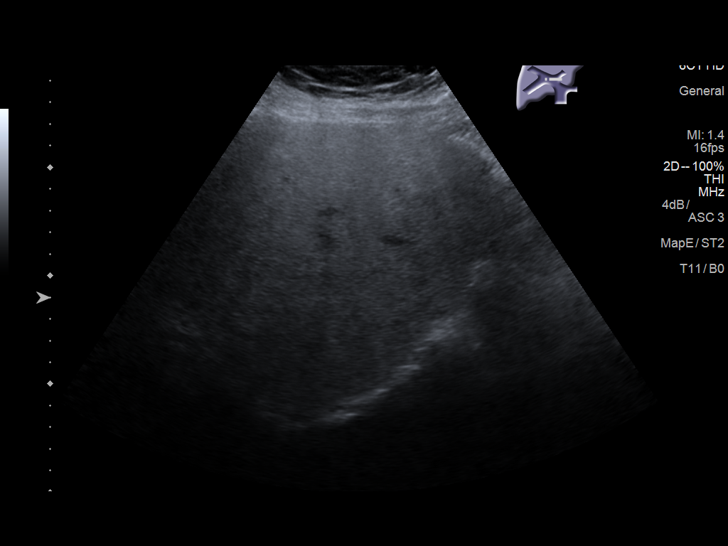
[im 26/41]
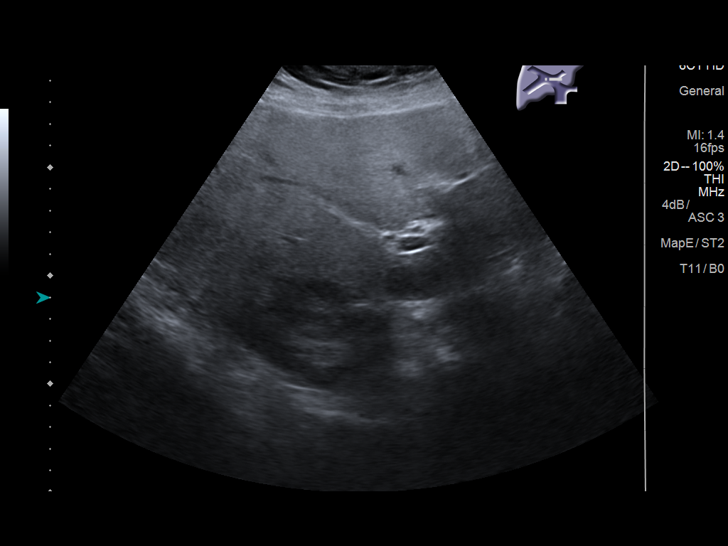
[im 27/41]
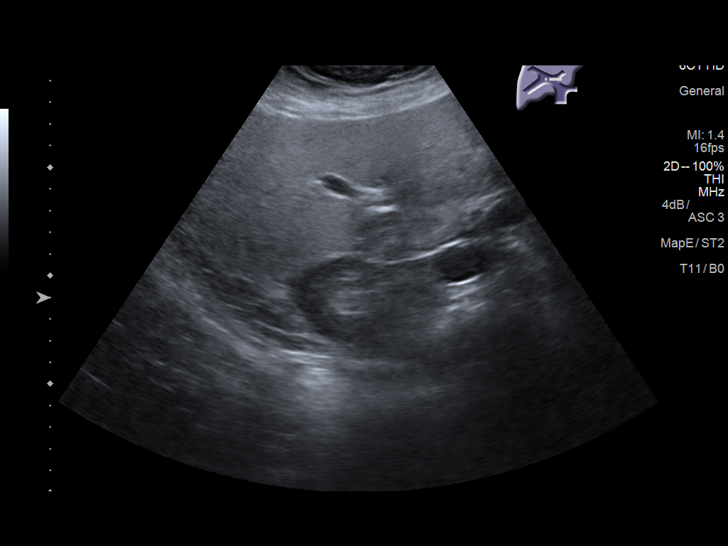
[im 31/41]
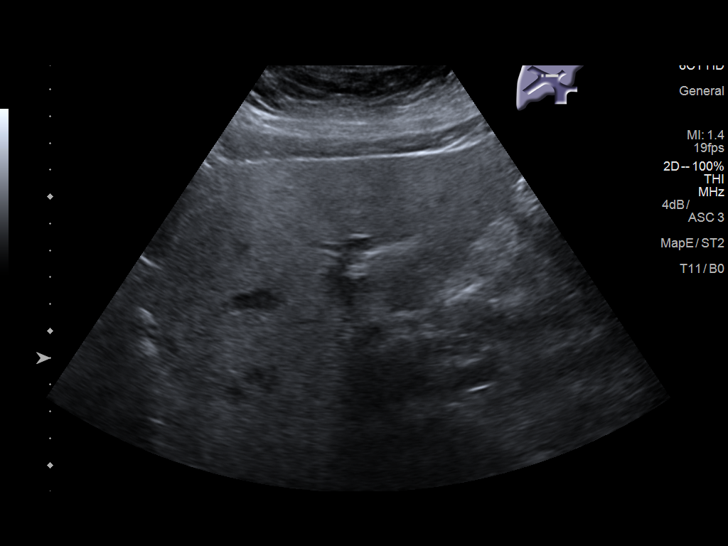
[im 34/41]
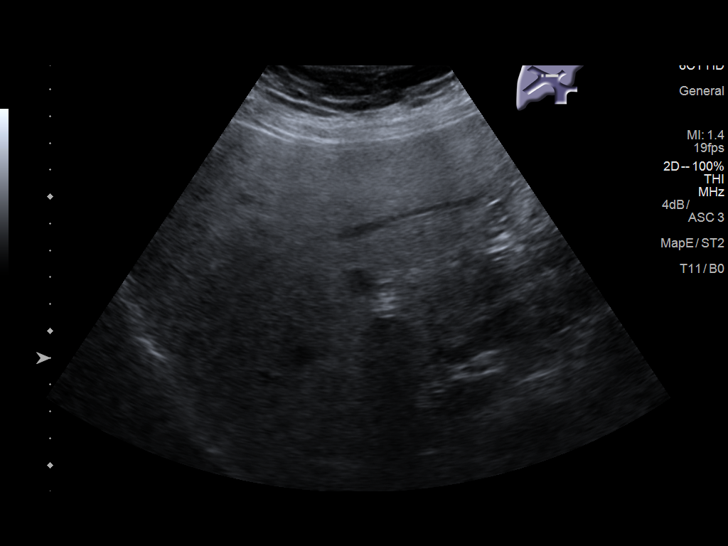
[im 37/41]
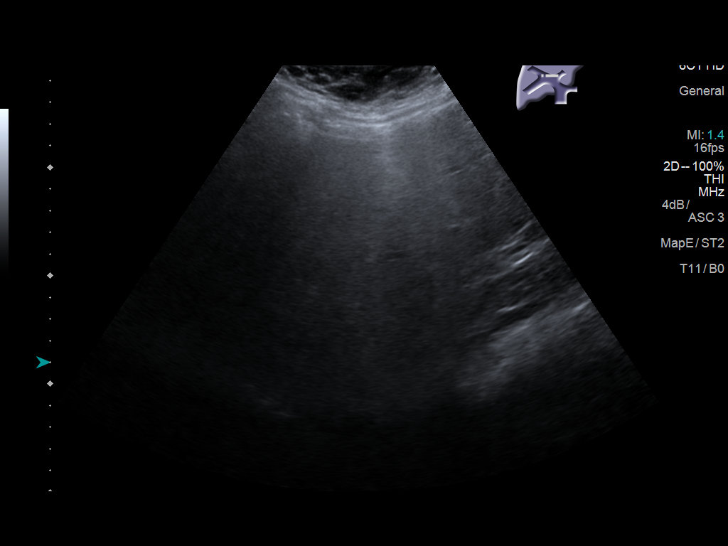
[im 41/41]
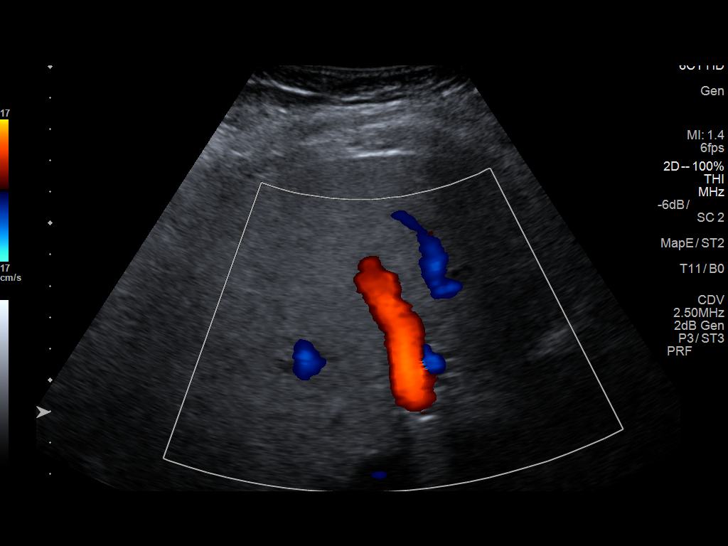

[14 of 25 positions shown; findings below may reference images not displayed]

FINDINGS: Gallbladder:

No gallstones or wall thickening visualized. No sonographic Murphy
sign noted by sonographer.

Common bile duct:

Diameter: 4.5 mm

Liver:

No focal lesion identified. Diffusely increased echogenicity,
suggesting steatosis. Portal vein is patent on color Doppler imaging
with normal direction of blood flow towards the liver.
IMPRESSION: 1. Normal sonographic evaluation of the gallbladder. No
cholelithiasis, evidence for acute cholecystitis, or biliary
dilatation.
2. Diffusely increased hepatic echogenicity, suggesting steatosis.

## 2020-05-08 ENCOUNTER — Other Ambulatory Visit: Payer: Self-pay

## 2020-05-08 ENCOUNTER — Ambulatory Visit
Admission: RE | Admit: 2020-05-08 | Discharge: 2020-05-08 | Disposition: A | Discharge status: Home / Self Care | Payer: Managed Care, Other (non HMO) | Source: Ambulatory Visit | Attending: *Deleted | Admitting: *Deleted

## 2020-05-08 DIAGNOSIS — Z1231 Encounter for screening mammogram for malignant neoplasm of breast: Secondary | ICD-10-CM

## 2020-05-14 ENCOUNTER — Other Ambulatory Visit: Payer: Self-pay | Admitting: *Deleted

## 2020-05-14 DIAGNOSIS — R928 Other abnormal and inconclusive findings on diagnostic imaging of breast: Secondary | ICD-10-CM

## 2020-05-15 ENCOUNTER — Ambulatory Visit
Admission: RE | Admit: 2020-05-15 | Discharge: 2020-05-15 | Disposition: A | Discharge status: Home / Self Care | Payer: Managed Care, Other (non HMO) | Source: Ambulatory Visit | Attending: *Deleted | Admitting: *Deleted

## 2020-05-15 ENCOUNTER — Ambulatory Visit: Payer: Medicaid Other | Admitting: Internal Medicine

## 2020-05-15 ENCOUNTER — Other Ambulatory Visit: Payer: Self-pay

## 2020-05-15 ENCOUNTER — Other Ambulatory Visit: Payer: Self-pay | Admitting: Obstetrics & Gynecology

## 2020-05-15 DIAGNOSIS — R928 Other abnormal and inconclusive findings on diagnostic imaging of breast: Secondary | ICD-10-CM

## 2023-08-03 ENCOUNTER — Other Ambulatory Visit: Payer: Self-pay

## 2023-08-03 ENCOUNTER — Encounter (HOSPITAL_BASED_OUTPATIENT_CLINIC_OR_DEPARTMENT_OTHER): Payer: Self-pay | Admitting: Emergency Medicine

## 2023-08-03 ENCOUNTER — Emergency Department (HOSPITAL_BASED_OUTPATIENT_CLINIC_OR_DEPARTMENT_OTHER)
Admission: EM | Admit: 2023-08-03 | Discharge: 2023-08-04 | Disposition: A | Discharge status: Home / Self Care | Payer: Medicaid Other | Attending: Emergency Medicine | Admitting: Emergency Medicine

## 2023-08-03 DIAGNOSIS — W540XXA Bitten by dog, initial encounter: Secondary | ICD-10-CM | POA: Insufficient documentation

## 2023-08-03 DIAGNOSIS — S81031A Puncture wound without foreign body, right knee, initial encounter: Secondary | ICD-10-CM | POA: Insufficient documentation

## 2023-08-03 DIAGNOSIS — S8991XA Unspecified injury of right lower leg, initial encounter: Secondary | ICD-10-CM | POA: Diagnosis present

## 2023-08-03 DIAGNOSIS — K4090 Unilateral inguinal hernia, without obstruction or gangrene, not specified as recurrent: Secondary | ICD-10-CM | POA: Insufficient documentation

## 2023-08-03 NOTE — ED Triage Notes (Signed)
Pt presents with dog bite to behind right knee.  Unknown dog but states the "dog had tags"  Tetanus within 5 years.  Bleeding controlled.  Pt also reports hernia to right groin she would like evaluated/

## 2023-08-04 ENCOUNTER — Emergency Department (HOSPITAL_BASED_OUTPATIENT_CLINIC_OR_DEPARTMENT_OTHER): Payer: Medicaid Other | Admitting: Radiology

## 2023-08-04 MED ORDER — AMOXICILLIN-POT CLAVULANATE 875-125 MG PO TABS
1.0000 | ORAL_TABLET | Freq: Once | ORAL | Status: AC
  Administered 2023-08-04: 1 via ORAL
  Filled 2023-08-04: qty 1

## 2023-08-04 MED ORDER — AMOXICILLIN-POT CLAVULANATE 875-125 MG PO TABS: 1.0000 | ORAL_TABLET | Freq: Two times a day (BID) | ORAL | 0 refills | Status: AC

## 2023-08-04 NOTE — Discharge Instructions (Signed)
You were seen today initially for dog bite.  Your tetanus is up-to-date.  The wound was cleaned and dressed.  Take Augmentin.  If you notice drainage, increasing redness, any signs or symptoms of infection you should be reevaluated.  You declined rabies vaccination and immunoglobulin.  Additionally you were concerned regarding a possible hernia.  Your exam is benign at this time.  Follow-up with general surgery if you have ongoing concerns.

## 2023-08-04 NOTE — ED Provider Notes (Signed)
[place] EMERGENCY DEPARTMENT AT [facility] Provider Note   CSN: [id number] Arrival date & time: 08/03/23  2323     History  Chief Complaint  Patient presents with   Animal Bite    Heather Christian is a 42 y.o. female.  HPI     This is a 42 year old female who presents with a dog bite to the right posterior leg.  Patient reports that a dog came out and bit her behind her right knee.  She does not know the dog but noted that the dog was wearing tags.  Dog ran away.  She is unsure who owns the dog.  Patient reports that her tetanus is up-to-date.  She has been ambulatory on that leg.  Additionally, patient states that she was planning to go to urgent care tomorrow to have a hernia looked at.  She states that she does heavy lifting at work and notices a groin bulge on the right.  She has intermittent pain.  Denies any change in bowel habits.  Currently she is asymptomatic.  Home Medications Prior to Admission medications   Medication Sig Start Date End Date Taking? Authorizing Provider  amoxicillin-clavulanate (AUGMENTIN) 875-125 MG tablet Take 1 tablet by mouth every 12 (twelve) hours. 08/04/23  Yes Allen Basista, Mayer Masker, MD  cetirizine (ZYRTEC) 10 MG tablet Take 10 mg by mouth daily as needed for allergies.    [provider]  fluticasone (FLONASE) 50 MCG/ACT nasal spray Place 1-2 sprays into both nostrils daily as needed for rhinitis.     [provider]  hydrocortisone (ANUSOL-HC) 25 MG suppository Place 1 suppository (25 mg total) rectally 2 (two) times daily as needed for hemorrhoids or anal itching. Patient not taking: Reported on 09/30/2017 08/22/17   Anyanwu, Jethro Bastos, MD  meloxicam (MOBIC) 7.5 MG tablet Take 1 tablet (7.5 mg total) by mouth daily. 07/02/18   Cathie Hoops, Amy V, PA-C  omeprazole (PRILOSEC) 20 MG capsule Take 20 mg 2 (two) times daily before a meal by mouth.    [provider]  phenylephrine-shark liver oil-mineral oil-petrolatum  (PREPARATION H) 0.25-3-14-71.9 % rectal ointment Place 1 application rectally 3 (three) times daily as needed for hemorrhoids.    [provider]  Prenatal Vit-Fe Fumarate-FA (PRENATAL MULTIVITAMIN) TABS tablet Take 1 tablet by mouth daily.     [provider]  ranitidine (ZANTAC) 150 MG tablet Take 150 mg at bedtime by mouth.    [provider]      Allergies    Azithromycin, Erythromycin, and Tape    Review of Systems   Review of Systems  Constitutional:  Negative for fever.  Gastrointestinal:        Hernia  Skin:  Positive for wound.  All other systems reviewed and are negative.   Physical Exam Updated Vital Signs BP 126/80 (BP Location: Right Arm)   Pulse 81   Resp 18   SpO2 99%  Physical Exam Vitals and nursing note reviewed.  Constitutional:      Appearance: She is well-developed. She is obese. She is not ill-appearing.  HENT:     Head: Normocephalic and atraumatic.  Eyes:     Pupils: Pupils are equal, round, and reactive to light.  Cardiovascular:     Rate and Rhythm: Normal rate and regular rhythm.     Heart sounds: Normal heart sounds.  Pulmonary:     Effort: Pulmonary effort is normal. No respiratory distress.  Abdominal:     Palpations: Abdomen is soft.  Comments: No tenderness or swelling noted in the right groin, no bulging  Musculoskeletal:     Cervical back: Neck supple.     Comments: Normal range of motion of the right knee, no significant erythema  Skin:    General: Skin is warm and dry.     Comments: Multiple punctate puncture wounds behind the right knee, no active bleeding  Neurological:     Mental Status: She is alert and oriented to person, place, and time.  Psychiatric:        Mood and Affect: Mood normal.     ED Results / Procedures / Treatments   Labs (all labs ordered are listed, but only abnormal results are displayed) Labs Reviewed - No data to display  EKG None  Radiology DG Knee Complete 4 Views  Right  Result Date: 08/04/2023 CLINICAL DATA:  Recent dog bite EXAM: RIGHT KNEE - COMPLETE 4+ VIEW COMPARISON:  None Available. FINDINGS: No acute fracture or dislocation is noted. Varices are noted along the medial leg. No joint effusion is noted. IMPRESSION: No acute abnormality to correspond with the given clinical history. Electronically Signed   By: Alcide Clever M.D.   On: 08/04/2023 00:31    Procedures Procedures    Medications Ordered in ED Medications  amoxicillin-clavulanate (AUGMENTIN) 875-125 MG per tablet 1 tablet (1 tablet Oral Given 08/04/23 0025)    ED Course/ Medical Decision Making/ A&P                                 Medical Decision Making Amount and/or Complexity of Data Reviewed Radiology: ordered.  Risk Prescription drug management.   This patient presents to the ED for concern of dog bite, hernia, this involves an extensive number of treatment options, and is a complaint that carries with it a high risk of complications and morbidity.  I considered the following differential and admission for this acute, potentially life threatening condition.  The differential diagnosis includes dog bite, infection, retained foreign body  MDM:    This is a 42 year old female who presents with initially a concern for dog bite.  She also was concerned she may have a hernia.  She is nontoxic and vital signs are reassuring.  She has multiple punctate puncture wounds behind the right knee consistent with a bite.  No obvious foreign body.  X-rays do not show foreign body.  She has full range of motion of the knee.  Does not appear to violate the joint.  Given inability to confirm dog's vaccine status, I recommended rabies vaccination and immunoglobulin.  Patient declined.  She was able to state understanding that if she contracted rabies that it was a deadly virus that could kill her.  She continued to decline vaccination.  She was given Augmentin.  Wound was copiously irrigated.   Regarding concern for hernia, there is no physical exam findings at this time to suggest hernia.  However, her history would suggest that she may have an inguinal hernia.  Will have her follow-up with general surgery.  (Labs, imaging, consults)  Labs: I Ordered, and personally interpreted labs.  The pertinent results include: None  Imaging Studies ordered: I ordered imaging studies including x-ray I independently visualized and interpreted imaging. I agree with the radiologist interpretation  Additional history obtained from chart review.  External records from outside source obtained and reviewed including prior evaluations  Cardiac Monitoring: The patient was not maintained on a  cardiac monitor.  If on the cardiac monitor, I personally viewed and interpreted the cardiac monitored which showed an underlying rhythm of: N/A  Reevaluation: After the interventions noted above, I reevaluated the patient and found that they have :stayed the same  Social Determinants of Health:  lives independently  Disposition: Discharge  Co morbidities that complicate the patient evaluation  Past Medical History:  Diagnosis Date   Abnormal Pap smear    LEEP 2008   Overactive bladder    Vaginal Pap smear, abnormal      Medicines Meds ordered this encounter  Medications   amoxicillin-clavulanate (AUGMENTIN) 875-125 MG per tablet 1 tablet   amoxicillin-clavulanate (AUGMENTIN) 875-125 MG tablet    Sig: Take 1 tablet by mouth every 12 (twelve) hours.    Dispense:  14 tablet    Refill:  0    I have reviewed the patients home medicines and have made adjustments as needed  Problem List / ED Course: Problem List Items Addressed This Visit   None Visit Diagnoses     Dog bite, initial encounter    -  Primary   Unilateral inguinal hernia without obstruction or gangrene, recurrence not specified                       Final Clinical Impression(s) / ED Diagnoses Final diagnoses:   Dog bite, initial encounter  Unilateral inguinal hernia without obstruction or gangrene, recurrence not specified    Rx / DC Orders ED Discharge Orders          Ordered    amoxicillin-clavulanate (AUGMENTIN) 875-125 MG tablet  Every 12 hours        08/04/23 0038              Shon Baton, MD 08/04/23 [phone]

## 2023-11-04 ENCOUNTER — Other Ambulatory Visit: Payer: Self-pay | Admitting: Internal Medicine

## 2023-11-04 DIAGNOSIS — Z1231 Encounter for screening mammogram for malignant neoplasm of breast: Secondary | ICD-10-CM

## 2023-11-23 ENCOUNTER — Ambulatory Visit
Admission: RE | Admit: 2023-11-23 | Discharge: 2023-11-23 | Disposition: A | Discharge status: Home / Self Care | Payer: Medicaid Other | Source: Ambulatory Visit | Attending: Internal Medicine | Admitting: Internal Medicine

## 2023-11-23 ENCOUNTER — Ambulatory Visit: Payer: Medicaid Other

## 2023-11-23 DIAGNOSIS — Z1231 Encounter for screening mammogram for malignant neoplasm of breast: Secondary | ICD-10-CM

## 2024-04-16 ENCOUNTER — Ambulatory Visit: Payer: Self-pay | Admitting: Podiatry

## 2024-04-23 ENCOUNTER — Ambulatory Visit: Admitting: Podiatry

## 2024-04-25 ENCOUNTER — Ambulatory Visit: Admitting: Family Medicine

## 2024-04-25 ENCOUNTER — Other Ambulatory Visit (HOSPITAL_COMMUNITY)
Admission: RE | Admit: 2024-04-25 | Discharge: 2024-04-25 | Disposition: A | Discharge status: Home / Self Care | Source: Ambulatory Visit | Attending: Family Medicine | Admitting: Family Medicine

## 2024-04-25 ENCOUNTER — Encounter: Payer: Self-pay | Admitting: Family Medicine

## 2024-04-25 VITALS — BP 116/76 | HR 73 | Ht 63.0 in | Wt 182.0 lb

## 2024-04-25 DIAGNOSIS — Z114 Encounter for screening for human immunodeficiency virus [HIV]: Secondary | ICD-10-CM | POA: Diagnosis not present

## 2024-04-25 DIAGNOSIS — Z1159 Encounter for screening for other viral diseases: Secondary | ICD-10-CM | POA: Diagnosis not present

## 2024-04-25 DIAGNOSIS — Z124 Encounter for screening for malignant neoplasm of cervix: Secondary | ICD-10-CM

## 2024-04-25 DIAGNOSIS — Z01419 Encounter for gynecological examination (general) (routine) without abnormal findings: Secondary | ICD-10-CM

## 2024-04-25 DIAGNOSIS — Z113 Encounter for screening for infections with a predominantly sexual mode of transmission: Secondary | ICD-10-CM

## 2024-04-25 NOTE — Progress Notes (Addendum)
 43 y.o. New GYN presents for AEX/PAP/STD screening.  Last Mammogram 11/23/23

## 2024-04-26 ENCOUNTER — Encounter: Payer: Self-pay | Admitting: Dermatology

## 2024-04-26 ENCOUNTER — Ambulatory Visit (INDEPENDENT_AMBULATORY_CARE_PROVIDER_SITE_OTHER): Payer: Medicaid Other | Admitting: Dermatology

## 2024-04-26 ENCOUNTER — Ambulatory Visit: Payer: Self-pay | Admitting: Family Medicine

## 2024-04-26 VITALS — BP 102/71

## 2024-04-26 DIAGNOSIS — B353 Tinea pedis: Secondary | ICD-10-CM | POA: Diagnosis not present

## 2024-04-26 DIAGNOSIS — D485 Neoplasm of uncertain behavior of skin: Secondary | ICD-10-CM

## 2024-04-26 DIAGNOSIS — L920 Granuloma annulare: Secondary | ICD-10-CM | POA: Diagnosis not present

## 2024-04-26 DIAGNOSIS — Z808 Family history of malignant neoplasm of other organs or systems: Secondary | ICD-10-CM | POA: Diagnosis not present

## 2024-04-26 DIAGNOSIS — L989 Disorder of the skin and subcutaneous tissue, unspecified: Secondary | ICD-10-CM | POA: Diagnosis not present

## 2024-04-26 LAB — RPR: RPR Ser Ql: NONREACTIVE

## 2024-04-26 LAB — CERVICOVAGINAL ANCILLARY ONLY
Bacterial Vaginitis (gardnerella): NEGATIVE
Candida Glabrata: NEGATIVE
Candida Vaginitis: NEGATIVE
Chlamydia: NEGATIVE
Comment: NEGATIVE
Comment: NEGATIVE
Comment: NEGATIVE
Comment: NEGATIVE
Comment: NEGATIVE
Comment: NORMAL
Neisseria Gonorrhea: NEGATIVE
Trichomonas: NEGATIVE

## 2024-04-26 LAB — HEPATITIS C ANTIBODY: Hep C Virus Ab: NONREACTIVE

## 2024-04-26 LAB — HIV ANTIBODY (ROUTINE TESTING W REFLEX): HIV Screen 4th Generation wRfx: NONREACTIVE

## 2024-04-26 LAB — HEPATITIS B SURFACE ANTIGEN: Hepatitis B Surface Ag: NEGATIVE

## 2024-04-26 MED ORDER — CLOTRIMAZOLE-BETAMETHASONE 1-0.05 % EX CREA: 1.0000 | TOPICAL_CREAM | Freq: Two times a day (BID) | CUTANEOUS | 2 refills | Status: AC

## 2024-04-26 NOTE — Patient Instructions (Addendum)

## 2024-04-26 NOTE — Progress Notes (Signed)
 New Patient Visit   Subjective  Heather Christian is a 43 y.o. female who presents for the following: New Pt - Dermatitis  Patient presents today with dermatitis located on the arms & legs that started around 2021. Areas are sometimes itchy, rated 5 out of 10. Areas have not spread. When flares she applies OTC Hydrocortisone . She was also using TMC 0.1% that was prescribed by PCP however it was ineffective so she D/C after a month of using it. Reports Hx of Bx (benign skin tag). Reports family Hx of skin cancer (maternal aunt - unsure of type).    The following portions of the chart were reviewed this encounter and updated as appropriate: medications, allergies, medical history  Review of Systems:  No other skin or systemic complaints except as noted in HPI or Assessment and Plan.  Objective  Well appearing patient in no apparent distress; mood and affect are within normal limits.   A focused examination was performed of the following areas: arms, legs & feet   Relevant exam findings are noted in the Assessment and Plan.                             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 Lesions are red, flaky, and itchy, with a tendency to disappear and reappear in different locations. 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 Differential diagnosis initially included ringworm, but non-spreading nature of lesions makes this less likely. Biopsy necessary to confirm GA diagnosis.  - Plan:    Perform shave biopsy of a representative lesion     - Obtain informed consent, explaining risks and benefits     - Apply local anesthesia     - Send specimen to pathology lab    Apply Aquaphor and Band-Aid to biopsy site    Provide patient with Aquaphor sample for home use    Follow up in 6 weeks to review biopsy results    If GA is confirmed, consider  intralesional steroid injections  2. Tinea Pedis (Athlete's Foot) - Assessment: Patient reports itching between toes, consistent with tinea pedis (athlete's foot). This condition is distinct from the suspected granuloma annulare lesions.  - Plan:    Prescribe trimazepamethasone topical application twice daily for 2 months    Recommend Zeosorb antifungal powder application to shoes weekly    Advise cleaning showers with Clorox    Follow up in 6 weeks to assess treatment response  NEOPLASM OF UNCERTAIN BEHAVIOR OF SKIN Left Elbow - Posterior Skin / nail biopsy Type of biopsy: tangential   Informed consent: discussed and consent obtained   Timeout: patient name, date of birth, surgical site, and procedure verified   Procedure prep:  Patient was prepped and draped in usual sterile fashion Prep type:  Isopropyl alcohol Anesthesia: the lesion was anesthetized in a standard fashion   Anesthetic:  1% lidocaine  w/ epinephrine  1-100,000 buffered w/ 8.4% NaHCO3 Instrument used: DermaBlade   Hemostasis achieved with: aluminum chloride   Outcome: patient tolerated procedure well   Post-procedure details: sterile dressing applied and wound care instructions given   Dressing type: petrolatum gauze and bandage    No follow-ups on file.   Documentation: I have reviewed the above documentation for accuracy and completeness, and I agree with the above.  I, Shirron Maranda, CMA, am acting as Neurosurgeon for [employer], DO.  Delon Lenis, DO

## 2024-04-27 LAB — SURGICAL PATHOLOGY

## 2024-04-27 LAB — CYTOLOGY - PAP
Adequacy: ABSENT
Comment: NEGATIVE
Diagnosis: NEGATIVE
High risk HPV: NEGATIVE

## 2024-04-27 NOTE — Progress Notes (Signed)
 ANNUAL EXAM Patient name: Heather Christian MRN [id number]  Date of birth: 13-Oct-1981 Chief Complaint:   NEW PATIENT/GYN  History of Present Illness:   Heather Christian is a 43 y.o. [phone] Caucasian female being seen today for a routine annual exam.  Current complaints: No current complaints  Patient's last menstrual period was 04/12/2024 (exact date).   The pregnancy intention screening data noted above was reviewed. Potential methods of contraception were discussed. The patient elected to proceed with No data recorded.   Last pap/20 05/2007. Results were: NILM w/ HRHPV negative. H/O abnormal pap: no Last mammogram: 11/23/2023. Results were: normal. Family h/o breast cancer: no Last colonoscopy: Never done. Results were: N/A. Family h/o colorectal cancer: no     04/25/2024    1:46 PM  Depression screen PHQ 2/9  Decreased Interest 1  Down, Depressed, Hopeless 1  PHQ - 2 Score 2  Altered sleeping 1  Tired, decreased energy 0  Change in appetite 0  Feeling bad or failure about yourself  1  Trouble concentrating 0  Moving slowly or fidgety/restless 0  Suicidal thoughts 0  PHQ-9 Score 4  Difficult doing work/chores Not difficult at all        04/25/2024    1:46 PM  GAD 7 : Generalized Anxiety Score  Nervous, Anxious, on Edge 1  Control/stop worrying 1  Worry too much - different things 1  Trouble relaxing 1  Restless 0  Easily annoyed or irritable 1  Afraid - awful might happen 0  Total GAD 7 Score 5  Anxiety Difficulty Not difficult at all     Review of Systems:   Pertinent items are noted in HPI Denies any headaches, blurred vision, fatigue, shortness of breath, chest pain, abdominal pain, abnormal vaginal discharge/itching/odor/irritation, problems with periods, bowel movements, urination, or intercourse unless otherwise stated above. Pertinent History Reviewed:  Reviewed past medical,surgical, social and family history.  Reviewed problem list,  medications and allergies. Physical Assessment:   Vitals:   04/25/24 1322  BP: 116/76  Pulse: 73  Weight: 182 lb (82.6 kg)  Height: 5' 3 (1.6 m)  Body mass index is 32.24 kg/m.        Physical Examination:   General appearance - well appearing, and in no distress  Mental status - alert, oriented to person, place, and time  Psych:  She has a normal mood and affect  Skin - warm and dry, normal color, no suspicious lesions noted  Chest - effort normal, all lung fields clear to auscultation bilaterally  Heart - normal rate and regular rhythm  Neck:  midline trachea, no thyromegaly or nodules  Abdomen - soft, nontender, nondistended, no masses or organomegaly  Pelvic - VULVA: normal appearing vulva with no masses, tenderness or lesions  VAGINA: normal appearing vagina with normal color and discharge, no lesions  CERVIX: normal appearing cervix without discharge or lesions, no CMT  Thin prep pap is done with HR HPV cotesting  UTERUS: uterus is felt to be normal size, shape, consistency and nontender   ADNEXA: No adnexal masses or tenderness noted.  Extremities:  No swelling or varicosities noted  Chaperone present for exam  No results found for this or any previous visit (from the past 24 hours).  Assessment & Plan:  1) Well-Woman Exam  2) STD screening Patient reports she would like STD screening.  Swabs collected as well as blood test.  Will call with results of any abnormalities.  Labs/procedures today: Pap smear, cervicovaginal  ancillary only, Pap B, hep A, hep C, HIV  Mammogram: in 1 year, or sooner if problems Colonoscopy: @ 45yo, or sooner if problems  Orders Placed This Encounter  Procedures   HIV antibody (with reflex)   Hepatitis C Antibody   Hepatitis B Surface AntiGEN   RPR    Meds: No orders of the defined types were placed in this encounter.   Follow-up: No follow-ups on file.  Addison Freimuth V Kallin Henk, MD 04/27/2024 8:26 AM

## 2024-05-07 ENCOUNTER — Ambulatory Visit: Payer: Self-pay | Admitting: Dermatology

## 2024-06-07 ENCOUNTER — Ambulatory Visit: Admitting: Dermatology

## 2024-07-20 ENCOUNTER — Ambulatory Visit (HOSPITAL_COMMUNITY)
Admission: RE | Admit: 2024-07-20 | Discharge: 2024-07-20 | Disposition: A | Discharge status: Home / Self Care | Source: Ambulatory Visit | Attending: Vascular Surgery | Admitting: Vascular Surgery

## 2024-07-20 ENCOUNTER — Other Ambulatory Visit (HOSPITAL_COMMUNITY): Payer: Self-pay | Admitting: Internal Medicine

## 2024-07-20 DIAGNOSIS — R600 Localized edema: Secondary | ICD-10-CM | POA: Insufficient documentation

## 2024-11-20 ENCOUNTER — Other Ambulatory Visit: Payer: Self-pay | Admitting: Internal Medicine

## 2024-11-20 DIAGNOSIS — Z1231 Encounter for screening mammogram for malignant neoplasm of breast: Secondary | ICD-10-CM

## 2024-11-28 ENCOUNTER — Ambulatory Visit
Admission: RE | Admit: 2024-11-28 | Discharge: 2024-11-28 | Disposition: A | Source: Ambulatory Visit | Attending: Internal Medicine | Admitting: Internal Medicine

## 2024-11-28 DIAGNOSIS — Z1231 Encounter for screening mammogram for malignant neoplasm of breast: Secondary | ICD-10-CM
